# Patient Record
Sex: Female | Born: 1965 | Race: White | Hispanic: No | Smoking: Current every day smoker
Health system: Southern US, Community
[De-identification: ages and names within clinical notes are randomized; demographics above are authoritative.]

## PROBLEM LIST (undated history)

## (undated) DIAGNOSIS — E785 Hyperlipidemia, unspecified: Secondary | ICD-10-CM

## (undated) DIAGNOSIS — I639 Cerebral infarction, unspecified: Secondary | ICD-10-CM

## (undated) DIAGNOSIS — F419 Anxiety disorder, unspecified: Secondary | ICD-10-CM

## (undated) DIAGNOSIS — F32A Depression, unspecified: Secondary | ICD-10-CM

## (undated) DIAGNOSIS — R519 Headache, unspecified: Secondary | ICD-10-CM

## (undated) DIAGNOSIS — F329 Major depressive disorder, single episode, unspecified: Secondary | ICD-10-CM

## (undated) DIAGNOSIS — R51 Headache: Secondary | ICD-10-CM

## (undated) HISTORY — PX: BACK SURGERY: SHX140

## (undated) HISTORY — PX: ECTOPIC PREGNANCY SURGERY: SHX613

## (undated) HISTORY — PX: ABDOMINAL HYSTERECTOMY: SHX81

## (undated) HISTORY — PX: APPENDECTOMY: SHX54

---

## 2004-03-23 ENCOUNTER — Ambulatory Visit (HOSPITAL_COMMUNITY): Admission: RE | Admit: 2004-03-23 | Discharge: 2004-03-24 | Payer: Self-pay | Admitting: Neurosurgery

## 2004-10-26 ENCOUNTER — Ambulatory Visit: Payer: Self-pay | Admitting: Family Medicine

## 2005-01-11 ENCOUNTER — Other Ambulatory Visit: Payer: Self-pay

## 2005-01-19 ENCOUNTER — Inpatient Hospital Stay: Payer: Self-pay | Admitting: Unknown Physician Specialty

## 2006-04-23 ENCOUNTER — Ambulatory Visit: Payer: Self-pay | Admitting: Physician Assistant

## 2006-11-22 ENCOUNTER — Ambulatory Visit: Payer: Self-pay | Admitting: Obstetrics and Gynecology

## 2006-12-20 ENCOUNTER — Ambulatory Visit: Payer: Self-pay | Admitting: Unknown Physician Specialty

## 2007-05-14 ENCOUNTER — Inpatient Hospital Stay: Payer: Self-pay | Admitting: Internal Medicine

## 2007-05-14 ENCOUNTER — Other Ambulatory Visit: Payer: Self-pay

## 2007-12-28 IMAGING — MG UNKNOWN MG STUDY
1 series · 4 of 4 positions shown · non-contrast
Comparison: none

REASON FOR EXAM: Baseline scr mammo
COMMENTS:

[R CC · right · 4 of 4 slices shown]
[im 1/4]
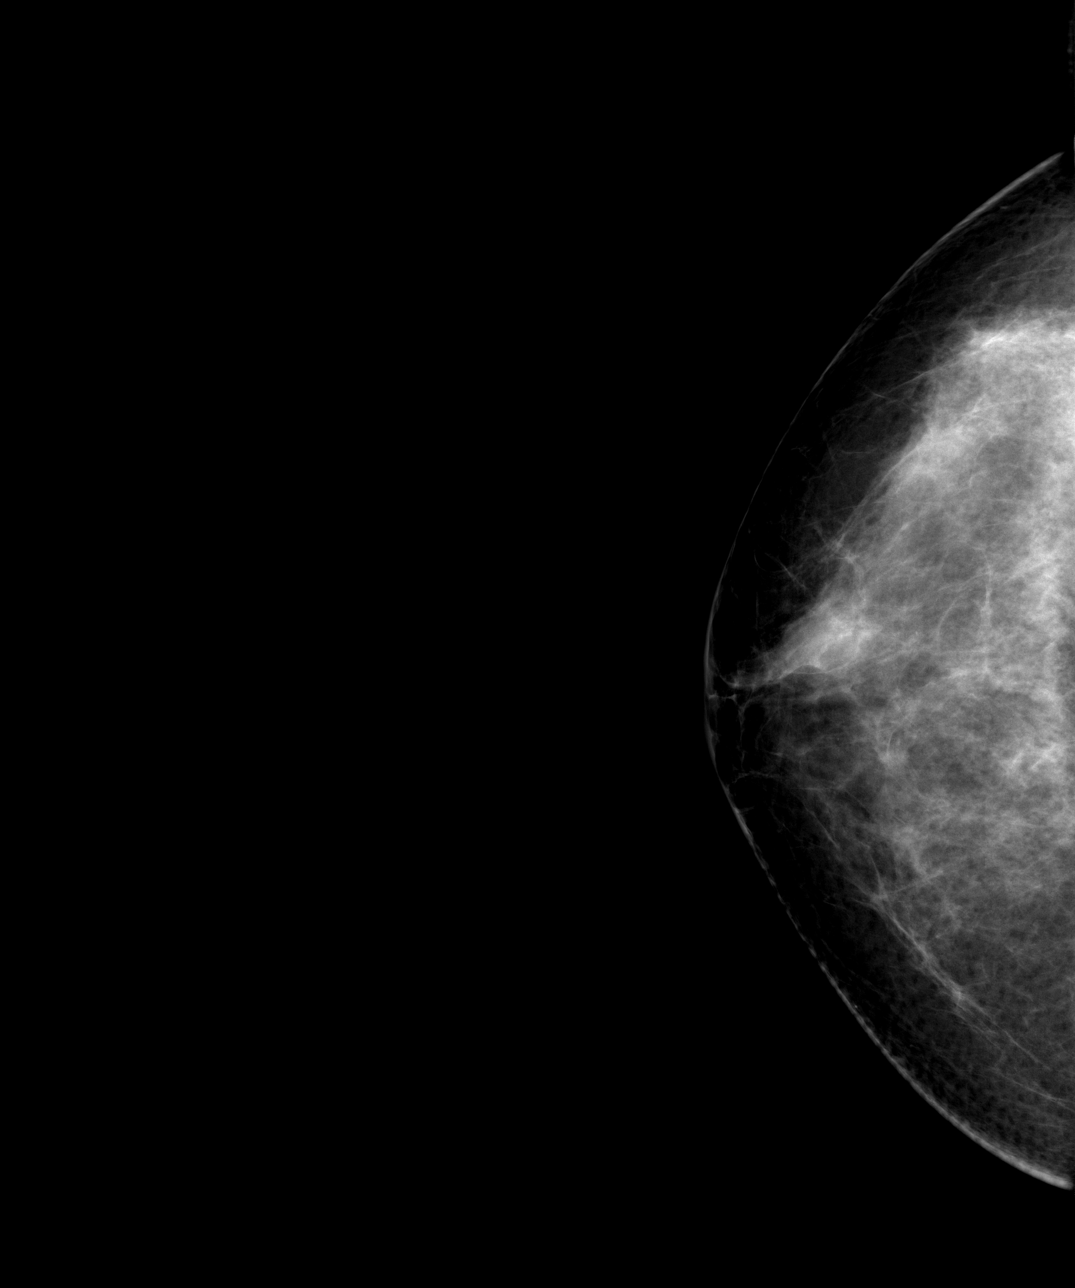
[im 2/4]
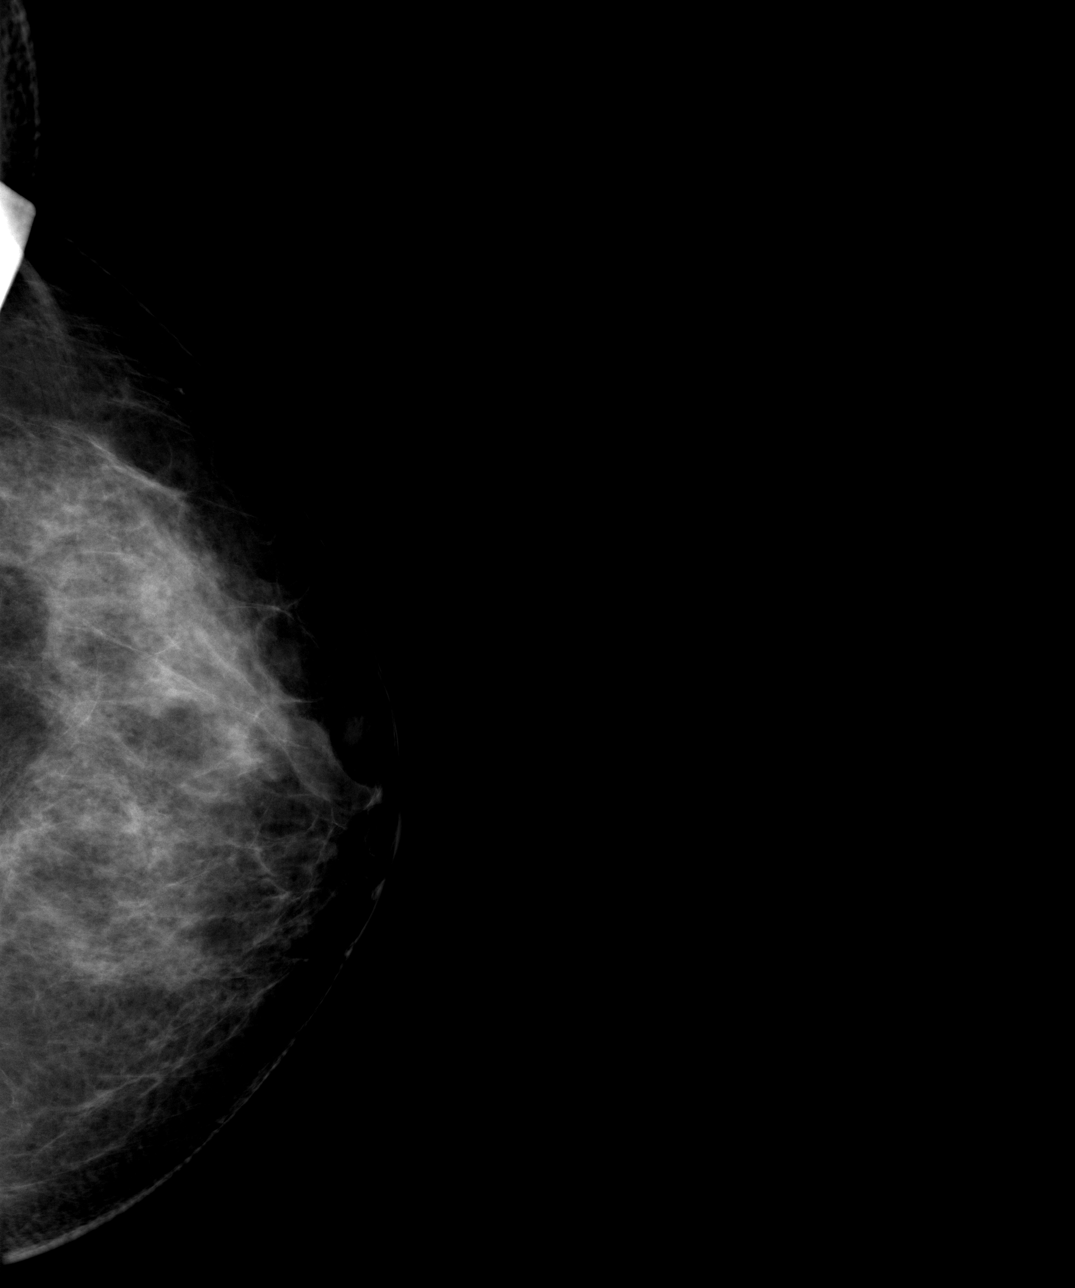
[im 3/4]
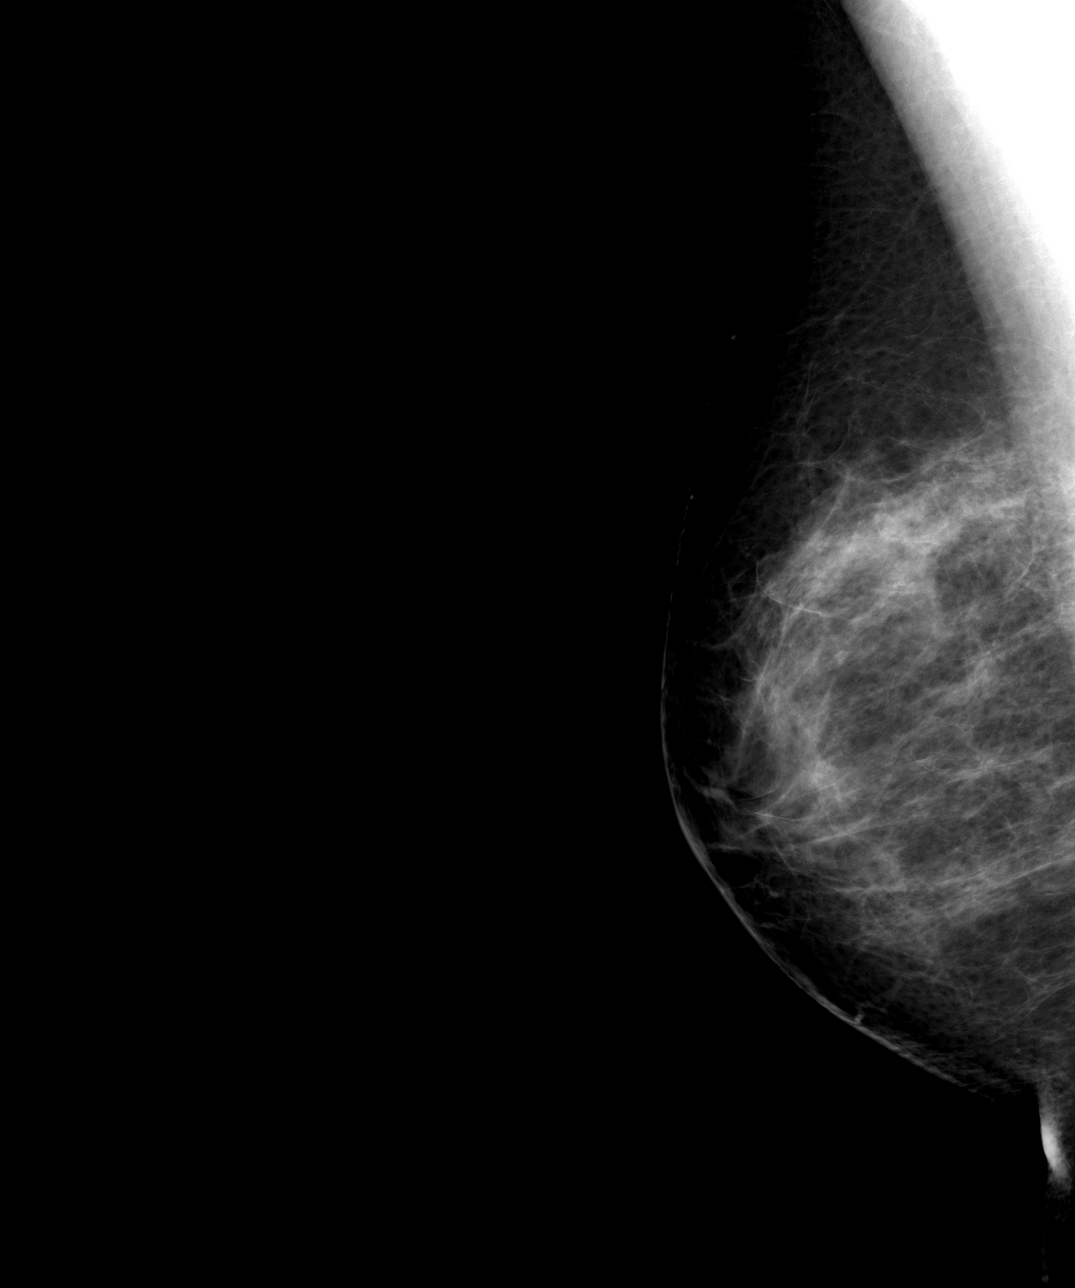
[im 4/4]
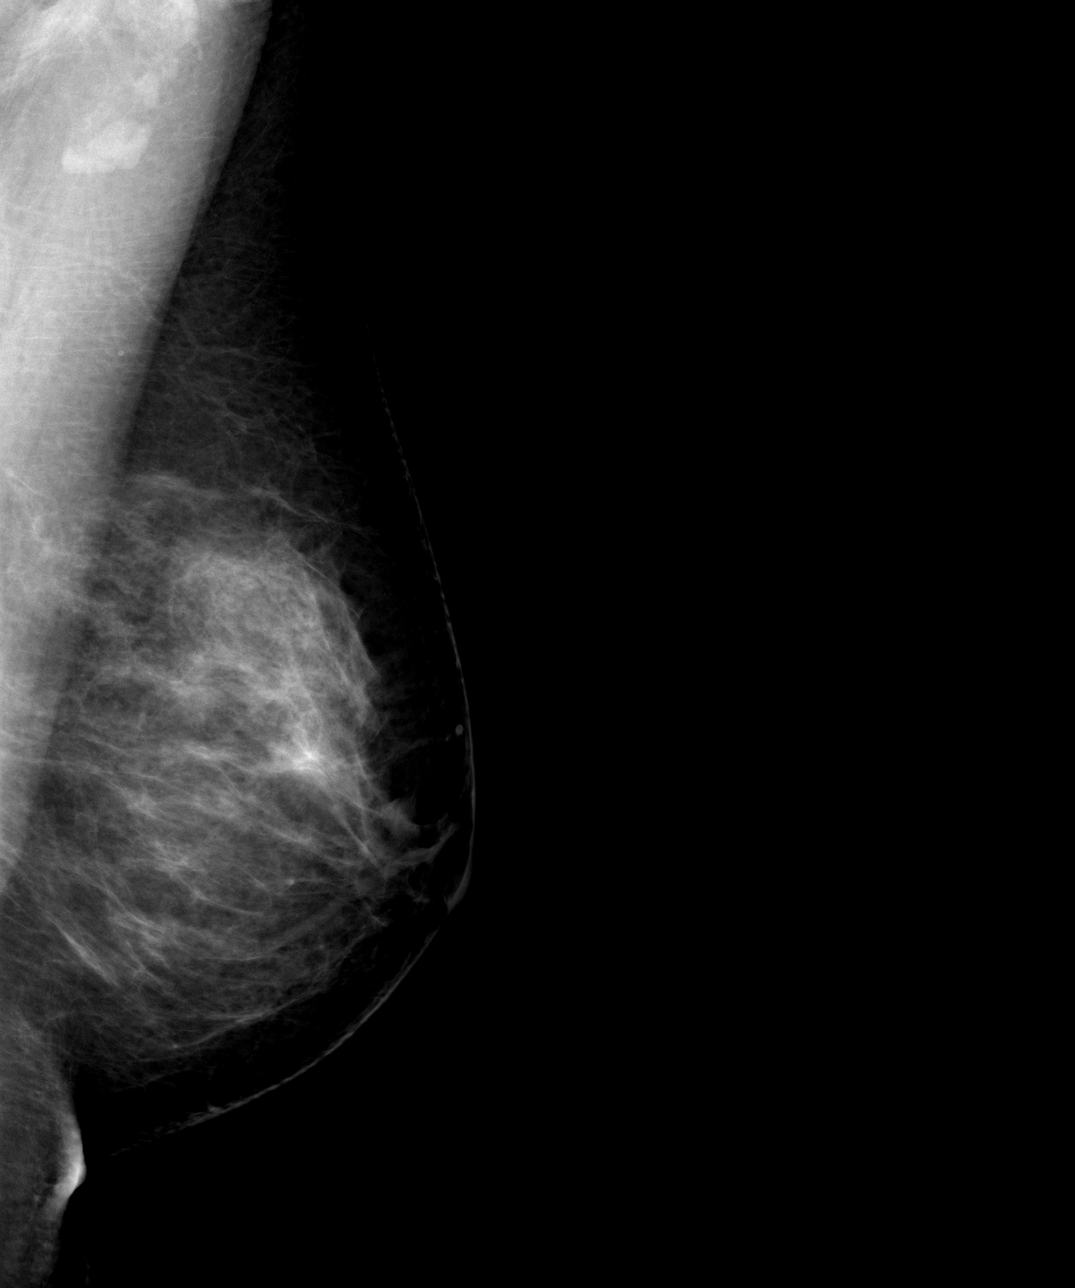

[4 of 4 positions shown; findings below may reference images not displayed]

PROCEDURE:     MAM - MAM DGTL SCREENING MAMMO W/CAD  - November 22, 2006  [DATE]

RESULT:       The current exam is compared to a prior exam dated 03/05/02
from [REDACTED]  The current exam reveals no dominant mass or
malignant appearing microcalcifications.   The breast parenchyma is
moderately dense.
IMPRESSION: 1.     Bilaterally benign-appearing screening mammography.
2.     Annual screening mammography is recommended.
3.     BI-RADS:  Category 2- Benign Finding.

A NEGATIVE MAMMOGRAM REPORT DOES NOT PRECLUDE BIOPSY OR OTHER EVALUATION OF
A CLINICALLY PALPABLE OR OTHERWISE SUSPICOUS MASS OR LESION.  BREAST CANCER
MAY NOT BE DETECTED BY MAMMOGRAPHY IN UP TO 10% OF CASES.

## 2008-04-01 ENCOUNTER — Ambulatory Visit: Payer: Self-pay | Admitting: Pain Medicine

## 2008-04-07 ENCOUNTER — Ambulatory Visit: Payer: Self-pay | Admitting: Pain Medicine

## 2008-04-22 ENCOUNTER — Ambulatory Visit: Payer: Self-pay | Admitting: Pain Medicine

## 2008-05-20 ENCOUNTER — Ambulatory Visit: Payer: Self-pay | Admitting: Physician Assistant

## 2009-03-01 ENCOUNTER — Ambulatory Visit: Payer: Self-pay | Admitting: Unknown Physician Specialty

## 2009-04-26 ENCOUNTER — Ambulatory Visit: Payer: Self-pay | Admitting: Neurology

## 2010-03-23 ENCOUNTER — Ambulatory Visit: Payer: Self-pay | Admitting: Unknown Physician Specialty

## 2010-12-18 ENCOUNTER — Emergency Department: Payer: Self-pay | Admitting: Emergency Medicine

## 2011-04-28 IMAGING — MG MAM DGTL SCREENING MAMMO W/CAD
1 series · 4 of 4 positions shown · non-contrast
Comparison: none

REASON FOR EXAM: SCR
COMMENTS:

[Series 241: R CC · right · 4 of 4 slices shown]
[im 1/4]
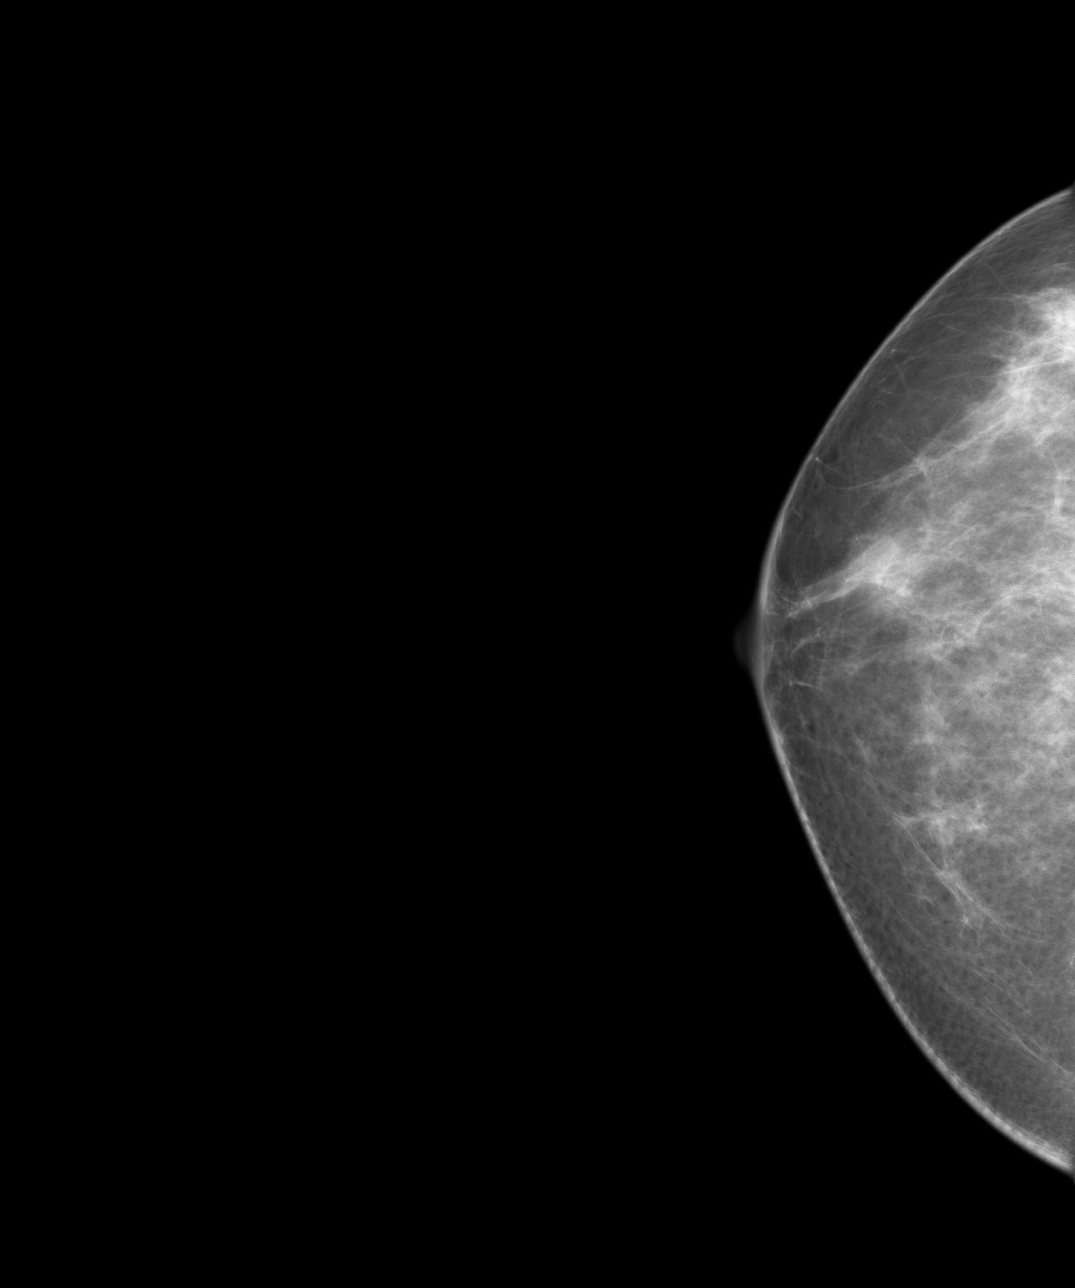
[im 2/4]
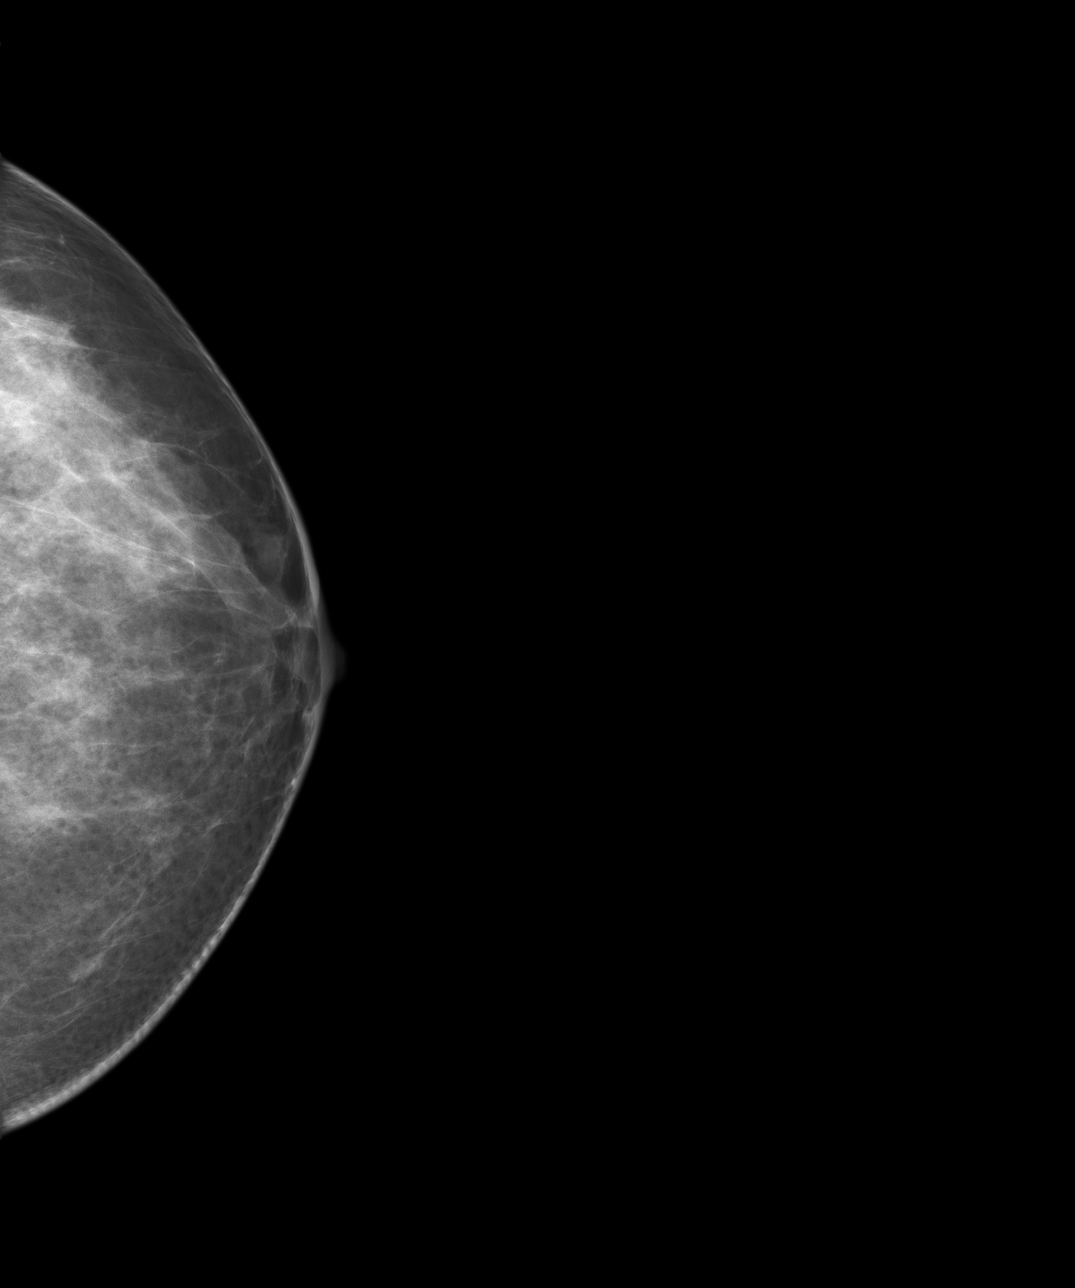
[im 3/4]
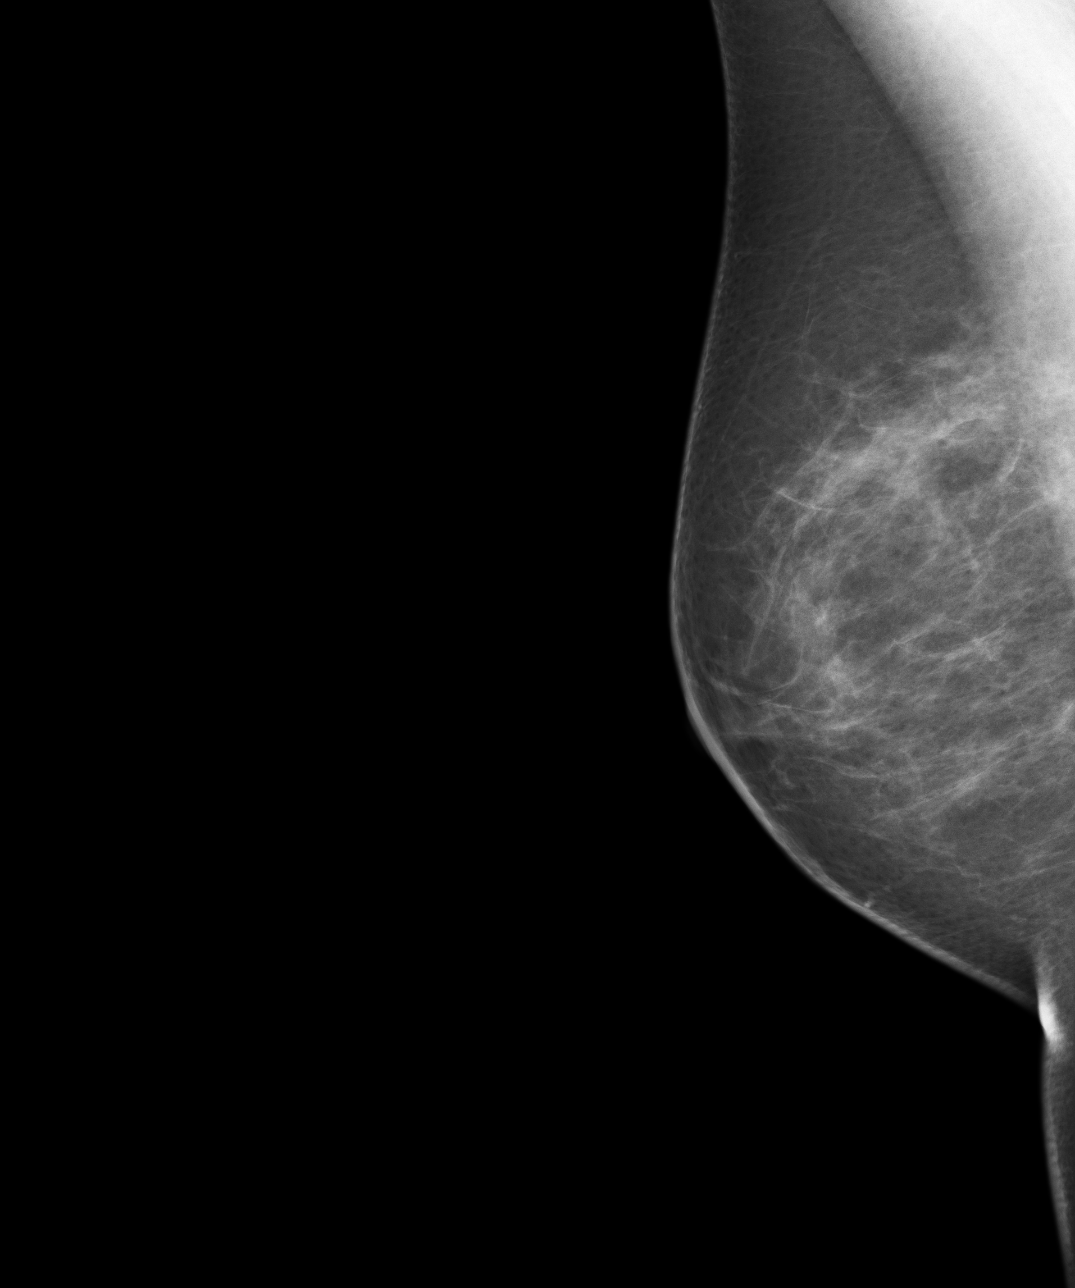
[im 4/4]
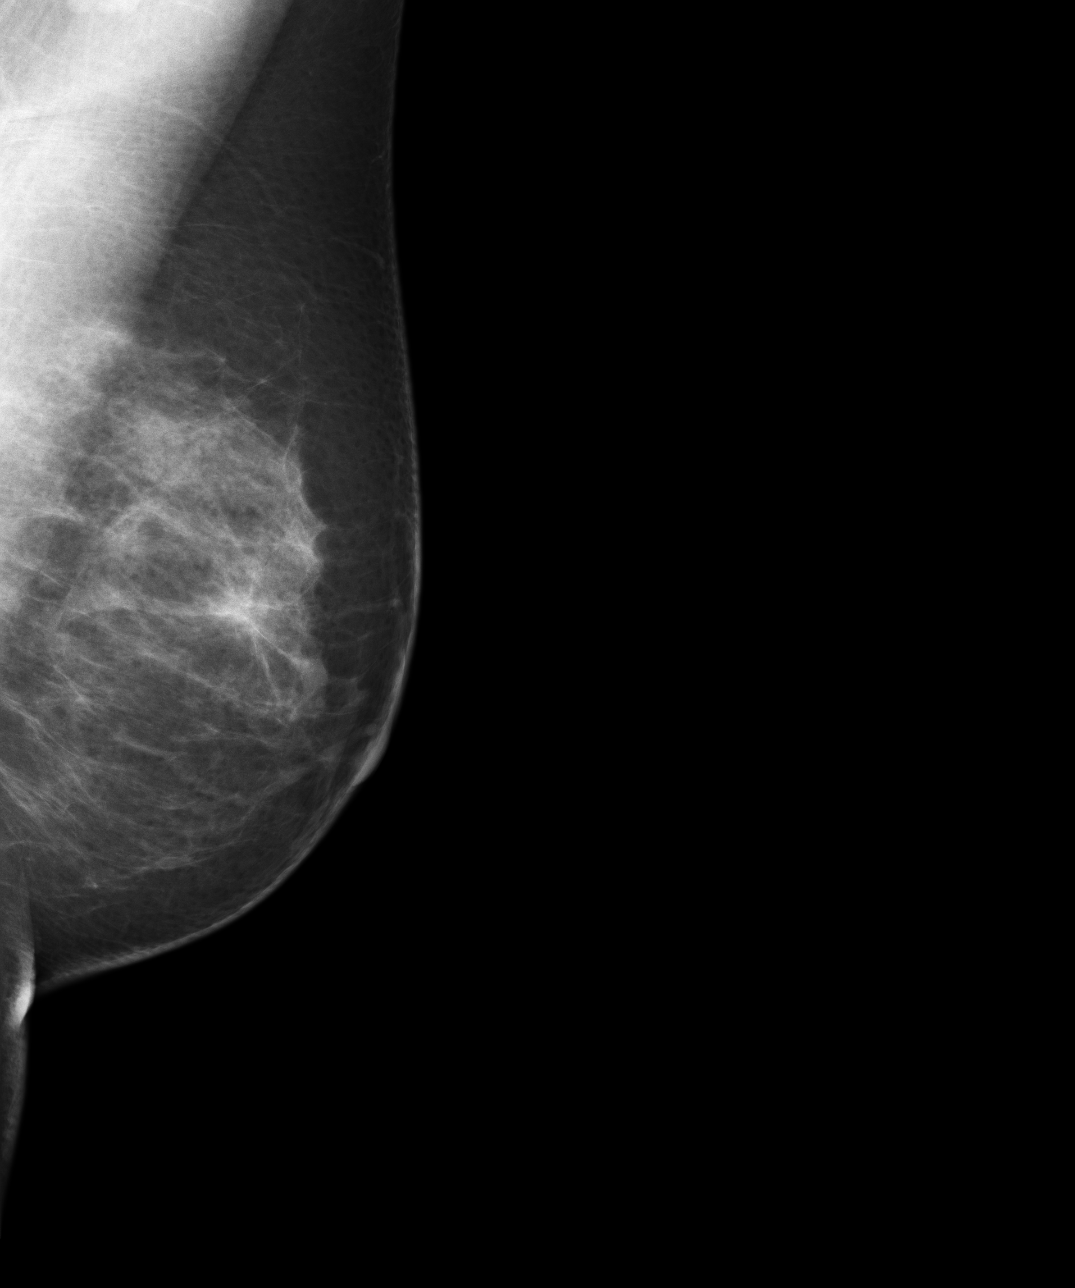

[4 of 4 positions shown; findings below may reference images not displayed]

PROCEDURE:     MAM - MAM DGTL SCREENING MAMMO W/CAD  - March 23, 2010  [DATE]

RESULT:       Comparison is made to the previous digital images dated
03/01/09 as well as 11/22/06.  The breasts exhibit a moderately dense
parenchymal pattern which has a stable appearance.  There is no developing
density, dominant mass or malignant-appearing calcification.
IMPRESSION: 1.     Stable benign-appearing bilateral mammogram.
2.     BI-RADS:  Category 2- Benign Finding.
3.     Please continue to encourage annual mammographic follow up.

A negative mammogram report does not preclude biopsy or other evaluation of
a clinically palpable or otherwise suspicious mass or lesion. Breast cancer
may not be detected by mammography in up to 10% of cases.

## 2018-04-11 ENCOUNTER — Inpatient Hospital Stay: Admission: RE | Admit: 2018-04-11 | Payer: Self-pay | Source: Ambulatory Visit

## 2018-04-18 ENCOUNTER — Other Ambulatory Visit: Payer: Self-pay

## 2018-04-18 ENCOUNTER — Encounter
Admission: RE | Admit: 2018-04-18 | Discharge: 2018-04-18 | Disposition: A | Discharge status: Home / Self Care | Payer: Medicare Other | Source: Ambulatory Visit | Attending: Podiatry | Admitting: Podiatry

## 2018-04-18 ENCOUNTER — Other Ambulatory Visit: Payer: Self-pay | Admitting: Podiatry

## 2018-04-18 DIAGNOSIS — Z01818 Encounter for other preprocedural examination: Secondary | ICD-10-CM | POA: Insufficient documentation

## 2018-04-18 DIAGNOSIS — Z01810 Encounter for preprocedural cardiovascular examination: Secondary | ICD-10-CM | POA: Insufficient documentation

## 2018-04-18 DIAGNOSIS — M21371 Foot drop, right foot: Secondary | ICD-10-CM | POA: Diagnosis not present

## 2018-04-18 HISTORY — DX: Headache, unspecified: R51.9

## 2018-04-18 HISTORY — DX: Major depressive disorder, single episode, unspecified: F32.9

## 2018-04-18 HISTORY — DX: Depression, unspecified: F32.A

## 2018-04-18 HISTORY — DX: Headache: R51

## 2018-04-18 HISTORY — DX: Anxiety disorder, unspecified: F41.9

## 2018-04-18 NOTE — Patient Instructions (Signed)
Your procedure is scheduled on: Friday 04/26/18 Report to [place]. To find out your arrival time please call [phone] between 1PM - 3PM on Thursday 04/25/18.  Remember: Instructions that are not followed completely may result in serious medical risk, up to and including death, or upon the discretion of your surgeon and anesthesiologist your surgery may need to be rescheduled.     _X__ 1. Do not eat food after midnight the night before your procedure.                 No gum chewing or hard candies. You may drink clear liquids up to 2 hours                 before you are scheduled to arrive for your surgery- DO not drink clear                 liquids within 2 hours of the start of your surgery.                 Clear Liquids include:  water, apple juice without pulp, clear carbohydrate                 drink such as Clearfast or Gatorade, Black Coffee or Tea (Do not add                 anything to coffee or tea).  __X__2.  On the morning of surgery brush your teeth with toothpaste and water, you                 may rinse your mouth with mouthwash if you wish.  Do not swallow any              toothpaste of mouthwash.     _X__ 3.  No Alcohol for 24 hours before or after surgery.   _X__ 4.  Do Not Smoke or use e-cigarettes For 24 Hours Prior to Your Surgery.                 Do not use any chewable tobacco products for at least 6 hours prior to                 surgery.  ____  5.  Bring all medications with you on the day of surgery if instructed.   __X__  6.  Notify your doctor if there is any change in your medical condition      (cold, fever, infections).     Do not wear jewelry, make-up, hairpins, clips or nail polish. Do not wear lotions, powders, or perfumes.  Do not shave 48 hours prior to surgery. Men may shave face and neck. Do not bring valuables to the hospital.    [facility] is not responsible for any belongings or  valuables.  Contacts, dentures/partials or body piercings may not be worn into surgery. Bring a case for your contacts, glasses or hearing aids, a denture cup will be supplied. Leave your suitcase in the car. After surgery it may be brought to your room. For patients admitted to the hospital, discharge time is determined by your treatment team.   Patients discharged the day of surgery will not be allowed to drive home.   Please read over the following fact sheets that you were given:   MRSA Information  __X__ Take these medicines the morning of surgery with A SIP OF WATER:  1. none  2.   3.   4.  5.  6.  ____ Fleet Enema (as directed)   __X__ Use CHG Soap/SAGE wipes as directed  ____ Use inhalers on the day of surgery  ____ Stop metformin/Janumet/Farxiga 2 days prior to surgery    ____ Take 1/2 of usual insulin dose the night before surgery. No insulin the morning          of surgery.   __x__ Stop Blood Thinners Coumadin/Plavix/Xarelto/Pleta/Pradaxa/Eliquis/Effient/Aspirin  on   Or contact your Surgeon, Cardiologist or Medical Doctor regarding  ability to stop your blood thinners  __X__ Stop Anti-inflammatories 7 days before surgery such as Advil, Ibuprofen, Motrin,  BC or Goodies Powder, Naprosyn, Naproxen, Aleve, Aspirin    __X__ Stopall herbal supplements, fish oil or vitamin E until after surgery.    ____ Bring C-Pap to the hospital.

## 2018-04-25 MED ORDER — CEFAZOLIN SODIUM-DEXTROSE 2-4 GM/100ML-% IV SOLN
2.0000 g | INTRAVENOUS | Status: AC
  Administered 2018-04-26: 2 g via INTRAVENOUS

## 2018-04-26 ENCOUNTER — Encounter: Payer: Self-pay | Admitting: *Deleted

## 2018-04-26 ENCOUNTER — Ambulatory Visit: Payer: Medicare Other | Admitting: Anesthesiology

## 2018-04-26 ENCOUNTER — Other Ambulatory Visit: Payer: Self-pay

## 2018-04-26 ENCOUNTER — Encounter
Admission: RE | Disposition: A | Discharge status: Home / Self Care | Payer: Self-pay | Source: Ambulatory Visit | Attending: Podiatry

## 2018-04-26 ENCOUNTER — Ambulatory Visit
Admission: RE | Admit: 2018-04-26 | Discharge: 2018-04-26 | Disposition: A | Discharge status: Home / Self Care | Payer: Medicare Other | Source: Ambulatory Visit | Attending: Podiatry | Admitting: Podiatry

## 2018-04-26 DIAGNOSIS — F17200 Nicotine dependence, unspecified, uncomplicated: Secondary | ICD-10-CM | POA: Diagnosis not present

## 2018-04-26 DIAGNOSIS — F419 Anxiety disorder, unspecified: Secondary | ICD-10-CM | POA: Diagnosis not present

## 2018-04-26 DIAGNOSIS — M21371 Foot drop, right foot: Secondary | ICD-10-CM | POA: Diagnosis present

## 2018-04-26 DIAGNOSIS — Z79899 Other long term (current) drug therapy: Secondary | ICD-10-CM | POA: Diagnosis not present

## 2018-04-26 DIAGNOSIS — F329 Major depressive disorder, single episode, unspecified: Secondary | ICD-10-CM | POA: Insufficient documentation

## 2018-04-26 HISTORY — PX: TENDON TRANSFER: SHX6109

## 2018-04-26 SURGERY — TRANSFER, TENDON
Anesthesia: General | Site: Foot | Laterality: Right | Wound class: Clean

## 2018-04-26 MED ORDER — FENTANYL CITRATE (PF) 100 MCG/2ML IJ SOLN
INTRAMUSCULAR | Status: DC | PRN
  Administered 2018-04-26 (×2): 25 ug via INTRAVENOUS
  Administered 2018-04-26: 50 ug via INTRAVENOUS

## 2018-04-26 MED ORDER — POVIDONE-IODINE 7.5 % EX SOLN
Freq: Once | CUTANEOUS | Status: DC
  Filled 2018-04-26: qty 118

## 2018-04-26 MED ORDER — LIDOCAINE HCL (PF) 2 % IJ SOLN
INTRAMUSCULAR | Status: AC
  Filled 2018-04-26: qty 10

## 2018-04-26 MED ORDER — FAMOTIDINE 20 MG PO TABS: 20.0000 mg | ORAL_TABLET | Freq: Once | ORAL | Status: DC

## 2018-04-26 MED ORDER — MIDAZOLAM HCL 2 MG/2ML IJ SOLN
INTRAMUSCULAR | Status: DC | PRN
  Administered 2018-04-26: 2 mg via INTRAVENOUS

## 2018-04-26 MED ORDER — ONDANSETRON HCL 4 MG/2ML IJ SOLN: 4.0000 mg | Freq: Four times a day (QID) | INTRAMUSCULAR | Status: DC | PRN

## 2018-04-26 MED ORDER — FENTANYL CITRATE (PF) 100 MCG/2ML IJ SOLN
INTRAMUSCULAR | Status: AC
  Administered 2018-04-26: 25 ug via INTRAVENOUS
  Filled 2018-04-26: qty 2

## 2018-04-26 MED ORDER — ONDANSETRON HCL 4 MG/2ML IJ SOLN
INTRAMUSCULAR | Status: AC
  Filled 2018-04-26: qty 2

## 2018-04-26 MED ORDER — PROPOFOL 10 MG/ML IV BOLUS
INTRAVENOUS | Status: DC | PRN
  Administered 2018-04-26: 160 mg via INTRAVENOUS
  Administered 2018-04-26: 20 mg via INTRAVENOUS

## 2018-04-26 MED ORDER — ONDANSETRON HCL 4 MG/2ML IJ SOLN: 4.0000 mg | Freq: Once | INTRAMUSCULAR | Status: DC | PRN

## 2018-04-26 MED ORDER — LIDOCAINE HCL (CARDIAC) PF 100 MG/5ML IV SOSY
PREFILLED_SYRINGE | INTRAVENOUS | Status: DC | PRN
  Administered 2018-04-26: 60 mg via INTRAVENOUS

## 2018-04-26 MED ORDER — FENTANYL CITRATE (PF) 100 MCG/2ML IJ SOLN
25.0000 ug | INTRAMUSCULAR | Status: AC | PRN
  Administered 2018-04-26 (×6): 25 ug via INTRAVENOUS

## 2018-04-26 MED ORDER — CEFAZOLIN SODIUM-DEXTROSE 2-4 GM/100ML-% IV SOLN
INTRAVENOUS | Status: AC
  Filled 2018-04-26: qty 100

## 2018-04-26 MED ORDER — GLYCOPYRROLATE 0.2 MG/ML IJ SOLN
INTRAMUSCULAR | Status: AC
Start: 2018-04-26 — End: ?
  Filled 2018-04-26: qty 1

## 2018-04-26 MED ORDER — TRAMADOL HCL 50 MG PO TABS: 50.0000 mg | ORAL_TABLET | Freq: Four times a day (QID) | ORAL | 0 refills | Status: AC | PRN

## 2018-04-26 MED ORDER — ONDANSETRON HCL 4 MG PO TABS: 4.0000 mg | ORAL_TABLET | Freq: Four times a day (QID) | ORAL | Status: DC | PRN

## 2018-04-26 MED ORDER — TRAMADOL HCL 50 MG PO TABS
50.0000 mg | ORAL_TABLET | Freq: Once | ORAL | Status: AC
Start: 2018-04-26 — End: 2018-04-26
  Administered 2018-04-26: 50 mg via ORAL

## 2018-04-26 MED ORDER — FAMOTIDINE 20 MG PO TABS
ORAL_TABLET | ORAL | Status: AC
  Administered 2018-04-26: 20 mg
  Filled 2018-04-26: qty 1

## 2018-04-26 MED ORDER — NEOMYCIN-POLYMYXIN B GU 40-200000 IR SOLN
Status: DC | PRN
  Administered 2018-04-26: 2 mL

## 2018-04-26 MED ORDER — DEXAMETHASONE SODIUM PHOSPHATE 10 MG/ML IJ SOLN
INTRAMUSCULAR | Status: DC | PRN
  Administered 2018-04-26: 10 mg via INTRAVENOUS

## 2018-04-26 MED ORDER — PROPOFOL 10 MG/ML IV BOLUS
INTRAVENOUS | Status: AC
  Filled 2018-04-26: qty 20

## 2018-04-26 MED ORDER — LACTATED RINGERS IV SOLN
INTRAVENOUS | Status: DC
  Administered 2018-04-26: 11:00:00 via INTRAVENOUS

## 2018-04-26 MED ORDER — DEXAMETHASONE SODIUM PHOSPHATE 10 MG/ML IJ SOLN
INTRAMUSCULAR | Status: AC
  Filled 2018-04-26: qty 1

## 2018-04-26 MED ORDER — MIDAZOLAM HCL 2 MG/2ML IJ SOLN
INTRAMUSCULAR | Status: AC
  Filled 2018-04-26: qty 2

## 2018-04-26 MED ORDER — TRAMADOL HCL 50 MG PO TABS
ORAL_TABLET | ORAL | Status: AC
  Administered 2018-04-26: 50 mg via ORAL
  Filled 2018-04-26: qty 1

## 2018-04-26 MED ORDER — BUPIVACAINE-EPINEPHRINE (PF) 0.25% -1:200000 IJ SOLN
INTRAMUSCULAR | Status: DC | PRN
  Administered 2018-04-26: 8 mL via PERINEURAL

## 2018-04-26 MED ORDER — ONDANSETRON HCL 4 MG/2ML IJ SOLN
INTRAMUSCULAR | Status: DC | PRN
  Administered 2018-04-26: 4 mg via INTRAVENOUS

## 2018-04-26 MED ORDER — FENTANYL CITRATE (PF) 100 MCG/2ML IJ SOLN
INTRAMUSCULAR | Status: AC
  Filled 2018-04-26: qty 2

## 2018-04-26 SURGICAL SUPPLY — 63 items
ANCHOR SWIVELOCK BIO COMP (Anchor) ×2 IMPLANT
BANDAGE ELASTIC 4 LF NS (GAUZE/BANDAGES/DRESSINGS) ×4 IMPLANT
BLADE SURG 15 STRL LF DISP TIS (BLADE) ×2 IMPLANT
BLADE SURG 15 STRL SS (BLADE) ×2
BLADE SURG MINI STRL (BLADE) ×2 IMPLANT
BNDG COHESIVE 4X5 TAN STRL (GAUZE/BANDAGES/DRESSINGS) ×2 IMPLANT
BNDG CONFORM 3 STRL LF (GAUZE/BANDAGES/DRESSINGS) ×2 IMPLANT
BNDG ESMARK 4X12 TAN STRL LF (GAUZE/BANDAGES/DRESSINGS) ×2 IMPLANT
CANISTER SUCT 1200ML W/VALVE (MISCELLANEOUS) ×2 IMPLANT
CUFF TOURN 18 STER (MISCELLANEOUS) ×2 IMPLANT
DRAPE FLUOR MINI C-ARM 54X84 (DRAPES) ×2 IMPLANT
DURAPREP 26ML APPLICATOR (WOUND CARE) ×2 IMPLANT
ELECT REM PT RETURN 9FT ADLT (ELECTROSURGICAL) ×2
ELECTRODE REM PT RTRN 9FT ADLT (ELECTROSURGICAL) ×1 IMPLANT
ETHIBOND 2 0 GREEN CT 2 30IN (SUTURE) ×2 IMPLANT
GAUZE PETRO XEROFOAM 1X8 (MISCELLANEOUS) ×2 IMPLANT
GAUZE SPONGE 4X4 12PLY STRL (GAUZE/BANDAGES/DRESSINGS) ×2 IMPLANT
GAUZE STRETCH 2X75IN STRL (MISCELLANEOUS) ×2 IMPLANT
GAUZE XEROFORM 4X4 STRL (GAUZE/BANDAGES/DRESSINGS) ×2 IMPLANT
GLOVE BIO SURGEON STRL SZ7.5 (GLOVE) ×2 IMPLANT
GLOVE INDICATOR 8.0 STRL GRN (GLOVE) ×2 IMPLANT
GOWN STRL REUS W/ TWL LRG LVL3 (GOWN DISPOSABLE) ×2 IMPLANT
GOWN STRL REUS W/TWL LRG LVL3 (GOWN DISPOSABLE) ×2
HANDLE YANKAUER SUCT BULB TIP (MISCELLANEOUS) ×2 IMPLANT
KIT SUTURE 1.8 Q-FIX DISP (KITS) ×2 IMPLANT
KIT TRANSTIBIAL (DISPOSABLE) ×2 IMPLANT
KIT TURNOVER KIT A (KITS) ×2 IMPLANT
LABEL OR SOLS (LABEL) ×2 IMPLANT
NDL MAYO CATGUT SZ5 (NEEDLE)
NDL SUT 5 .5 CRC TPR PNT MAYO (NEEDLE) IMPLANT
NEEDLE FILTER BLUNT 18X 1/2SAF (NEEDLE) ×1
NEEDLE FILTER BLUNT 18X1 1/2 (NEEDLE) ×1 IMPLANT
NEEDLE HYPO 25X1 1.5 SAFETY (NEEDLE) ×2 IMPLANT
NS IRRIG 500ML POUR BTL (IV SOLUTION) ×2 IMPLANT
PACK EXTREMITY ARMC (MISCELLANEOUS) ×2 IMPLANT
PAD CAST CTTN 4X4 STRL (SOFTGOODS) ×1 IMPLANT
PADDING CAST COTTON 4X4 STRL (SOFTGOODS) ×1
RASP SM TEAR CROSS CUT (RASP) ×2 IMPLANT
REAMER 6 CANN FEM STRL (BIT) ×2 IMPLANT
SOL PREP PVP 2OZ (MISCELLANEOUS) ×2
SOLUTION PREP PVP 2OZ (MISCELLANEOUS) ×1 IMPLANT
SPLINT CAST 1 STEP 5X30 WHT (MISCELLANEOUS) ×2 IMPLANT
SPLINT FAST PLASTER 5X30 (CAST SUPPLIES) ×1
SPLINT PLASTER CAST FAST 5X30 (CAST SUPPLIES) ×1 IMPLANT
SPONGE LAP 18X18 RF (DISPOSABLE) ×2 IMPLANT
STOCKINETTE M/LG 89821 (MISCELLANEOUS) ×2 IMPLANT
STRIP CLOSURE SKIN 1/2X4 (GAUZE/BANDAGES/DRESSINGS) ×2 IMPLANT
SUT 2 FIBERLOOP 20 STRT BLUE (SUTURE) ×2
SUT ETHILON 3-0 FS-10 30 BLK (SUTURE) ×2
SUT MNCRL+ 5-0 VIOLET P-3 (SUTURE) ×1 IMPLANT
SUT MONOCRYL 5-0 (SUTURE) ×1
SUT VIC AB 0 SH 27 (SUTURE) ×2 IMPLANT
SUT VIC AB 2-0 SH 27 (SUTURE) ×2
SUT VIC AB 2-0 SH 27XBRD (SUTURE) ×2 IMPLANT
SUT VIC AB 3-0 SH 27 (SUTURE) ×2
SUT VIC AB 3-0 SH 27X BRD (SUTURE) ×2 IMPLANT
SUT VIC AB 4-0 FS2 27 (SUTURE) ×4 IMPLANT
SUT VICRYL AB 3-0 FS1 BRD 27IN (SUTURE) ×2 IMPLANT
SUTURE 2 FIBERLOOP 20 STRT BLU (SUTURE) ×1 IMPLANT
SUTURE EHLN 3-0 FS-10 30 BLK (SUTURE) ×1 IMPLANT
SWABSTK COMLB BENZOIN TINCTURE (MISCELLANEOUS) ×2 IMPLANT
SYR 10ML LL (SYRINGE) ×4 IMPLANT
SYR 3ML LL SCALE MARK (SYRINGE) ×2 IMPLANT

## 2018-04-26 NOTE — Transfer of Care (Signed)
Immediate Anesthesia Transfer of Care Note  Patient: Diana Jarvis  Procedure(s) Performed: TENDON TRANSFER-ANTERIOR TIBIAL TENDON TRANSFER (Right Foot)  Patient Location: PACU  Anesthesia Type:General  Level of Consciousness: sedated  Airway & Oxygen Therapy: Patient Spontanous Breathing and Patient connected to face mask oxygen  Post-op Assessment: Report given to RN and Post -op Vital signs reviewed and stable  Post vital signs: Reviewed and stable  Last Vitals:  Vitals Value Taken Time  BP 119/78 04/26/2018  1:49 PM  Temp 37.1 C 04/26/2018  1:49 PM  Pulse 102 04/26/2018  1:55 PM  Resp 17 04/26/2018  1:55 PM  SpO2 100 % 04/26/2018  1:55 PM  Vitals shown include unvalidated device data.  Last Pain:  Vitals:   04/26/18 1349  TempSrc:   PainSc: Asleep      Patients Stated Pain Goal: 3 (19/47/12 5271)  Complications: No apparent anesthesia complications

## 2018-04-26 NOTE — Anesthesia Postprocedure Evaluation (Signed)
Anesthesia Post Note  Patient: Diana Jarvis  Procedure(s) Performed: TENDON TRANSFER-ANTERIOR TIBIAL TENDON TRANSFER (Right Foot)  Patient location during evaluation: PACU Anesthesia Type: General Level of consciousness: awake and alert Pain management: pain level controlled Vital Signs Assessment: post-procedure vital signs reviewed and stable Respiratory status: spontaneous breathing, nonlabored ventilation, respiratory function stable and patient connected to nasal cannula oxygen Cardiovascular status: blood pressure returned to baseline and stable Postop Assessment: no apparent nausea or vomiting Anesthetic complications: no     Last Vitals:  Vitals:   04/26/18 1434 04/26/18 1452  BP: 103/74 97/68  Pulse: (!) 104 89  Resp: 19 16  Temp:  (!) 36.3 C  SpO2: 91% 94%    Last Pain:  Vitals:   04/26/18 1452  TempSrc: Tympanic  PainSc: [place]

## 2018-04-26 NOTE — Anesthesia Post-op Follow-up Note (Signed)
Anesthesia QCDR form completed.        

## 2018-04-26 NOTE — Discharge Instructions (Signed)

## 2018-04-26 NOTE — Anesthesia Procedure Notes (Signed)
Procedure Name: LMA Insertion Date/Time: 04/26/2018 12:13 PM Performed by: Eben Burow, CRNA Pre-anesthesia Checklist: Patient identified, Emergency Drugs available, Suction available, Patient being monitored and Timeout performed Patient Re-evaluated:Patient Re-evaluated prior to induction Oxygen Delivery Method: Circle system utilized Preoxygenation: Pre-oxygenation with 100% oxygen Induction Type: IV induction LMA: LMA inserted LMA Size: 4.0 Number of attempts: 1 Placement Confirmation: positive ETCO2 and breath sounds checked- equal and bilateral Tube secured with: Tape Dental Injury: Teeth and Oropharynx as per pre-operative assessment

## 2018-04-26 NOTE — Anesthesia Preprocedure Evaluation (Signed)
Anesthesia Evaluation  Patient identified by MRN, date of birth, ID band Patient awake    Reviewed: Allergy & Precautions, NPO status , Patient's Chart, lab work & pertinent test results, reviewed documented beta blocker date and time   Airway Mallampati: II  TM Distance: >3 FB     Dental  (+) Chipped   Pulmonary Current Smoker,           Cardiovascular      Neuro/Psych  Headaches, PSYCHIATRIC DISORDERS Anxiety Depression    GI/Hepatic   Endo/Other    Renal/GU      Musculoskeletal   Abdominal   Peds  Hematology   Anesthesia Other Findings Uses B-blocker.  Reproductive/Obstetrics                             Anesthesia Physical Anesthesia Plan  ASA: III  Anesthesia Plan: General   Post-op Pain Management:    Induction: Intravenous  PONV Risk Score and Plan:   Airway Management Planned: LMA  Additional Equipment:   Intra-op Plan:   Post-operative Plan:   Informed Consent: I have reviewed the patients History and Physical, chart, labs and discussed the procedure including the risks, benefits and alternatives for the proposed anesthesia with the patient or authorized representative who has indicated his/her understanding and acceptance.     Plan Discussed with: CRNA  Anesthesia Plan Comments:         Anesthesia Quick Evaluation

## 2018-04-26 NOTE — Op Note (Signed)
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    EBL: Minimal    Anesthesia:local and general    Hemostasis: 0.25% bupivacaine with epinephrine infiltrated at incision sites    Specimen: None    Complications: None    Operative indications:Diana Jarvis is an 52 y.o. that presents today for surgical intervention.  The risks/benefits/alternatives/complications have been discussed and consent has been given.    Procedure:  Patient was brought into the OR and placed on the operating table in thesupine position. After anesthesia was obtained theright lower extremity was prepped and draped in usual sterile fashion.  Attention was directed to the right lower extremity where at the anterior medial insertion of the anterior tibial tendon a small longitudinal incision was performed.  Sharp and blunt dissection carried down to the tendon.  The tendon was then incised completely from its insertion.  On the anterior aspect of the lower leg a small incision was made with sharp and blunt dissection carried down to the anterior tibial tendon sheath.  This was then entered.  A third incision was made overlying the lateral cuneiform on the right foot.  Sharp and blunt dissection carried down to the periosteum.  The anterior tibial tendon was initially delivered from its medial insertion to the lower leg incision on the tibia.  It was then delivered into the lateral cuneiform incision site.  A stitch was placed through the tendon.  A 5.5 mm Bio-Tenodesis screw from [employer] was placed into the lateral cuneiform.  Next the tendon was delivered through the drill hole delivered into the cuneiform.  A Keith needle had been used with a whipstitch and this was delivered to the plantar aspect of the foot.  The foot was held in a neutral 90 degree  position.  The 5.5 mm Bio-Tenodesis screw was then placed with excellent stability noted.  The suture was then cut.  All wounds were flushed with copious amounts of irrigation.  Layered closure performed with 3-0 and 4-0 Vicryl and the skin reapproximated with a 3-0 nylon.  Patient was placed in a equalizer walker boot with the foot held in a neutral 90 degree position.    Patient tolerated the procedure and anesthesia well.  Was transported from the OR to the PACU with all vital signs stable and vascular status intact. To be discharged per routine protocol.  Will follow up in approximately 1 week in the outpatient clinic.

## 2018-04-29 ENCOUNTER — Encounter: Payer: Self-pay | Admitting: Podiatry

## 2018-04-29 NOTE — H&P (Signed)
Pre-op H & P was performed prior to surgery.  All questions answered.  No changes in medical history.

## 2020-08-06 ENCOUNTER — Other Ambulatory Visit: Payer: Self-pay | Admitting: Family Medicine

## 2020-08-06 DIAGNOSIS — Z1231 Encounter for screening mammogram for malignant neoplasm of breast: Secondary | ICD-10-CM

## 2020-09-02 ENCOUNTER — Other Ambulatory Visit: Payer: Self-pay

## 2020-09-02 ENCOUNTER — Ambulatory Visit
Admission: RE | Admit: 2020-09-02 | Discharge: 2020-09-02 | Disposition: A | Discharge status: Home / Self Care | Payer: Medicare Other | Source: Ambulatory Visit | Attending: Family Medicine | Admitting: Family Medicine

## 2020-09-02 DIAGNOSIS — Z1231 Encounter for screening mammogram for malignant neoplasm of breast: Secondary | ICD-10-CM

## 2021-03-11 ENCOUNTER — Other Ambulatory Visit: Admission: RE | Admit: 2021-03-11 | Payer: Medicare Other | Source: Ambulatory Visit

## 2021-03-15 ENCOUNTER — Encounter: Payer: Self-pay | Admitting: *Deleted

## 2021-03-15 ENCOUNTER — Ambulatory Visit: Payer: Medicare Other | Admitting: Certified Registered"

## 2021-03-15 ENCOUNTER — Ambulatory Visit
Admission: RE | Admit: 2021-03-15 | Discharge: 2021-03-15 | Disposition: A | Discharge status: Home / Self Care | Payer: Medicare Other | Attending: Gastroenterology | Admitting: Gastroenterology

## 2021-03-15 ENCOUNTER — Other Ambulatory Visit: Payer: Self-pay

## 2021-03-15 ENCOUNTER — Encounter
Admission: RE | Disposition: A | Discharge status: Home / Self Care | Payer: Self-pay | Source: Home / Self Care | Attending: Gastroenterology

## 2021-03-15 DIAGNOSIS — Z8673 Personal history of transient ischemic attack (TIA), and cerebral infarction without residual deficits: Secondary | ICD-10-CM | POA: Diagnosis not present

## 2021-03-15 DIAGNOSIS — K6389 Other specified diseases of intestine: Secondary | ICD-10-CM | POA: Insufficient documentation

## 2021-03-15 DIAGNOSIS — Z888 Allergy status to other drugs, medicaments and biological substances status: Secondary | ICD-10-CM | POA: Insufficient documentation

## 2021-03-15 DIAGNOSIS — Z885 Allergy status to narcotic agent status: Secondary | ICD-10-CM | POA: Insufficient documentation

## 2021-03-15 DIAGNOSIS — Z7982 Long term (current) use of aspirin: Secondary | ICD-10-CM | POA: Diagnosis not present

## 2021-03-15 DIAGNOSIS — Z79899 Other long term (current) drug therapy: Secondary | ICD-10-CM | POA: Diagnosis not present

## 2021-03-15 DIAGNOSIS — Z91040 Latex allergy status: Secondary | ICD-10-CM | POA: Insufficient documentation

## 2021-03-15 DIAGNOSIS — K529 Noninfective gastroenteritis and colitis, unspecified: Secondary | ICD-10-CM | POA: Diagnosis not present

## 2021-03-15 DIAGNOSIS — Z791 Long term (current) use of non-steroidal anti-inflammatories (NSAID): Secondary | ICD-10-CM | POA: Diagnosis not present

## 2021-03-15 DIAGNOSIS — D123 Benign neoplasm of transverse colon: Secondary | ICD-10-CM | POA: Insufficient documentation

## 2021-03-15 DIAGNOSIS — E785 Hyperlipidemia, unspecified: Secondary | ICD-10-CM | POA: Insufficient documentation

## 2021-03-15 DIAGNOSIS — Z1211 Encounter for screening for malignant neoplasm of colon: Secondary | ICD-10-CM | POA: Diagnosis present

## 2021-03-15 HISTORY — DX: Cerebral infarction, unspecified: I63.9

## 2021-03-15 HISTORY — DX: Hyperlipidemia, unspecified: E78.5

## 2021-03-15 HISTORY — PX: COLONOSCOPY WITH PROPOFOL: SHX5780

## 2021-03-15 SURGERY — COLONOSCOPY WITH PROPOFOL
Anesthesia: General

## 2021-03-15 MED ORDER — PROPOFOL 500 MG/50ML IV EMUL
INTRAVENOUS | Status: DC | PRN
  Administered 2021-03-15: 125 ug/kg/min via INTRAVENOUS

## 2021-03-15 MED ORDER — PROPOFOL 500 MG/50ML IV EMUL
INTRAVENOUS | Status: AC
  Filled 2021-03-15: qty 50

## 2021-03-15 MED ORDER — SODIUM CHLORIDE 0.9 % IV SOLN: INTRAVENOUS | Status: DC

## 2021-03-15 MED ORDER — PHENYLEPHRINE HCL (PRESSORS) 10 MG/ML IV SOLN
INTRAVENOUS | Status: DC | PRN
  Administered 2021-03-15: 100 ug via INTRAVENOUS

## 2021-03-15 MED ORDER — LIDOCAINE HCL (CARDIAC) PF 100 MG/5ML IV SOSY
PREFILLED_SYRINGE | INTRAVENOUS | Status: DC | PRN
  Administered 2021-03-15: 50 mg via INTRAVENOUS

## 2021-03-15 MED ORDER — PROPOFOL 10 MG/ML IV BOLUS
INTRAVENOUS | Status: DC | PRN
  Administered 2021-03-15: 90 mg via INTRAVENOUS
  Administered 2021-03-15: 20 mg via INTRAVENOUS

## 2021-03-15 NOTE — Interval H&P Note (Signed)
History and Physical Interval Note:  03/15/2021 10:07 AM  Diana Jarvis  has presented today for surgery, with the diagnosis of SCREEN.  The various methods of treatment have been discussed with the patient and family. After consideration of risks, benefits and other options for treatment, the patient has consented to  Procedure(s): COLONOSCOPY WITH PROPOFOL (N/A) as a surgical intervention.  The patient's history has been reviewed, patient examined, no change in status, stable for surgery.  I have reviewed the patient's chart and labs.  Questions were answered to the patient's satisfaction.     Lesly Rubenstein  Ok to proceed with colonoscopy

## 2021-03-15 NOTE — Transfer of Care (Signed)
Immediate Anesthesia Transfer of Care Note  Patient: Diana Jarvis  Procedure(s) Performed: COLONOSCOPY WITH PROPOFOL (N/A )  Patient Location: PACU  Anesthesia Type:MAC  Level of Consciousness: awake, alert  and oriented  Airway & Oxygen Therapy: Patient Spontanous Breathing  Post-op Assessment: Report given to RN and Post -op Vital signs reviewed and stable  Post vital signs: stable  Last Vitals:  Vitals Value Taken Time  BP    Temp    Pulse 82 03/15/21 1043  Resp 24 03/15/21 1043  SpO2 99 % 03/15/21 1043  Vitals shown include unvalidated device data.  Last Pain:  Vitals:   03/15/21 0935  TempSrc: Temporal  PainSc: 0-No pain         Complications: No complications documented.

## 2021-03-15 NOTE — H&P (Signed)
Outpatient short stay form Pre-procedure 03/15/2021 10:02 AM Raylene Miyamoto MD, MPH  Primary Physician: Dr. Kary Kos  Reason for visit:  Screening  History of present illness:   55 y/o lady with history of HLD and anxiety here for screening colonoscopy. No family history of GI malignancies. No blood thinners. History of partial hysterectomy and ectopic pregnancy.    Current Facility-Administered Medications:  .  0.9 %  sodium chloride infusion, , Intravenous, Continuous, Alexah Kivett, Hilton Cork, MD, Last Rate: 20 mL/hr at 03/15/21 0945, New Bag at 03/15/21 0945  Medications Prior to Admission  Medication Sig Dispense Refill Last Dose  . ALPRAZolam (XANAX) 1 MG tablet Take 0.5-1 mg by mouth 2 (two) times daily as needed for anxiety.   03/14/2021 at Unknown time  . aspirin EC 81 MG tablet Take 81 mg by mouth at bedtime.   Past Week at Unknown time  . cyanocobalamin (,VITAMIN B-12,) 1000 MCG/ML injection Inject 1,000 mcg into the muscle every 14 (fourteen) days.   Past Week at Unknown time  . docusate sodium (COLACE) 100 MG capsule Take 100 mg by mouth at bedtime.   Past Week at Unknown time  . ergocalciferol (VITAMIN D2) 50000 units capsule Take 50,000 Units by mouth every Friday.   Past Week at Unknown time  . metoprolol succinate (TOPROL-XL) 25 MG 24 hr tablet Take 25 mg by mouth at bedtime.   03/14/2021 at Unknown time  . multivitamin (VIT W/EXTRA C) CHEW chewable tablet Chew 1 tablet by mouth daily.   Past Week at Unknown time  . rosuvastatin (CRESTOR) 5 MG tablet Take 5 mg by mouth at bedtime.   03/14/2021 at Unknown time  . diclofenac sodium (VOLTAREN) 1 % GEL Apply 1 application topically 4 (four) times daily as needed (for pain in foot and leg).     . fentaNYL (DURAGESIC - DOSED MCG/HR) 25 MCG/HR patch Place 25 mcg onto the skin every other day.     Marland Kitchen PARoxetine (PAXIL) 40 MG tablet Take 40 mg by mouth at bedtime.     . topiramate (TOPAMAX) 100 MG tablet Take 100 mg by mouth at bedtime.      . traMADol (ULTRAM) 50 MG tablet Take 100 mg by mouth every 6 (six) hours as needed.     . venlafaxine XR (EFFEXOR-XR) 75 MG 24 hr capsule Take 75 mg by mouth at bedtime.        Allergies  Allergen Reactions  . Latex Hives and Rash  . Codeine     Patient states interacts with fentanyl patch she is wearing  . Lyrica [Pregabalin] Other (See Comments)    Can't remember anything  . Pepto-Bismol [Bismuth] Nausea And Vomiting  . Phenergan [Promethazine Hcl] Other (See Comments)    Hallucination  . Zocor [Simvastatin] Other (See Comments)    Weak in legs     Past Medical History:  Diagnosis Date  . Anxiety   . Depression   . Headache   . Hyperlipidemia   . Stroke [facility])    TIA LAST OCTOBER    Review of systems:  Otherwise negative.    Physical Exam  Gen: Alert, oriented. Appears stated age.  HEENT: PERRLA. Lungs: No respiratory distress CV: RRR Abd: soft, benign, no masses Ext: No edema    Planned procedures: Proceed with colonoscopy. The patient understands the nature of the planned procedure, indications, risks, alternatives and potential complications including but not limited to bleeding, infection, perforation, damage to internal organs and possible oversedation/side effects from  anesthesia. The patient agrees and gives consent to proceed.  Please refer to procedure notes for findings, recommendations and patient disposition/instructions.     Raylene Miyamoto MD, MPH Gastroenterology 03/15/2021  10:02 AM

## 2021-03-15 NOTE — Op Note (Signed)
[facility] Patient Name: Diana Jarvis Procedure Date: 03/15/2021 10:03 AM MRN: [id number] Account #: [id number] Date of Birth: 1966/04/23 Admit Type: Outpatient Age: 55 Room: [facility] ENDO ROOM 1 Gender: Female Note Status: Finalized Procedure:             Colonoscopy Indications:           Screening for colorectal malignant neoplasm Providers:             Andrey Farmer MD, MD Referring MD:          Irven Easterly. Kary Kos, MD (Referring MD) Medicines:             Monitored Anesthesia Care Complications:         No immediate complications. Estimated blood loss:                         Minimal. Procedure:             Pre-Anesthesia Assessment:                        - Prior to the procedure, a History and Physical was                         performed, and patient medications and allergies were                         reviewed. The patient is competent. The risks and                         benefits of the procedure and the sedation options and                         risks were discussed with the patient. All questions                         were answered and informed consent was obtained.                         Patient identification and proposed procedure were                         verified by the physician, the nurse, the anesthetist                         and the technician in the endoscopy suite. Mental                         Status Examination: alert and oriented. Airway                         Examination: normal oropharyngeal airway and neck                         mobility. Respiratory Examination: clear to                         auscultation. CV Examination: normal. Prophylactic  Antibiotics: The patient does not require prophylactic                         antibiotics. Prior Anticoagulants: The patient has                         taken no previous anticoagulant or antiplatelet                         agents.  ASA Grade Assessment: I - A normal, healthy                         patient. After reviewing the risks and benefits, the                         patient was deemed in satisfactory condition to                         undergo the procedure. The anesthesia plan was to use                         monitored anesthesia care (MAC). Immediately prior to                         administration of medications, the patient was                         re-assessed for adequacy to receive sedatives. The                         heart rate, respiratory rate, oxygen saturations,                         blood pressure, adequacy of pulmonary ventilation, and                         response to care were monitored throughout the                         procedure. The physical status of the patient was                         re-assessed after the procedure.                        After obtaining informed consent, the colonoscope was                         passed under direct vision. Throughout the procedure,                         the patient's blood pressure, pulse, and oxygen                         saturations were monitored continuously. The                         Colonoscope was introduced through the anus and  advanced to the the cecum, identified by appendiceal                         orifice and ileocecal valve. The colonoscopy was                         performed without difficulty. The patient tolerated                         the procedure well. The quality of the bowel                         preparation was good. Findings:      The perianal and digital rectal examinations were normal.      A localized area of mildly thickened folds of the mucosa was found at       the appendiceal orifice. Biopsies were taken with a cold forceps for       histology. Estimated blood loss was minimal.      A 6 mm polyp was found in the hepatic flexure. The polyp was sessile.       The  polyp was removed with a cold snare. Resection and retrieval were       complete. Estimated blood loss was minimal.      The exam was otherwise without abnormality on direct and retroflexion       views. Impression:            - Thickened folds of the mucosa at the appendiceal                         orifice. Biopsied.                        - One 6 mm polyp at the hepatic flexure, removed with                         a cold snare. Resected and retrieved.                        - The examination was otherwise normal on direct and                         retroflexion views. Recommendation:        - Discharge patient to home.                        - Resume previous diet.                        - Continue present medications.                        - Await pathology results.                        - Repeat colonoscopy for surveillance based on                         pathology results.                        -  Return to referring physician as previously                         scheduled. Procedure Code(s):     --- Professional ---                        [phone], Colonoscopy, flexible; with removal of                         tumor(s), polyp(s), or other lesion(s) by snare                         technique                        45380, 19, Colonoscopy, flexible; with biopsy, single                         or multiple Diagnosis Code(s):     --- Professional ---                        Z12.11, Encounter for screening for malignant neoplasm                         of colon                        K63.89, Other specified diseases of intestine                        K63.5, Polyp of colon CPT copyright 2019 [employer]. All rights reserved. The codes documented in this report are preliminary and upon coder review may  be revised to meet current compliance requirements. Andrey Farmer MD, MD 03/15/2021 10:41:01 AM Number of Addenda: 0 Note Initiated On: 03/15/2021 10:03  AM Estimated Blood Loss:  Estimated blood loss was minimal.      [facility]

## 2021-03-15 NOTE — Anesthesia Preprocedure Evaluation (Addendum)
Anesthesia Evaluation  Patient identified by MRN, date of birth, ID band Patient awake    Reviewed: Allergy & Precautions, H&P , NPO status , Patient's Chart, lab work & pertinent test results, reviewed documented beta blocker date and time   Airway Mallampati: II   Neck ROM: full    Dental  (+) Poor Dentition   Pulmonary neg pulmonary ROS, Current Smoker and Patient abstained from smoking.,    Pulmonary exam normal        Cardiovascular Exercise Tolerance: Good negative cardio ROS Normal cardiovascular exam Rhythm:regular Rate:Normal     Neuro/Psych  Headaches, Anxiety Depression CVA negative neurological ROS  negative psych ROS   GI/Hepatic negative GI ROS, Neg liver ROS,   Endo/Other  negative endocrine ROS  Renal/GU negative Renal ROS  negative genitourinary   Musculoskeletal   Abdominal   Peds  Hematology negative hematology ROS (+)   Anesthesia Other Findings Past Medical History: No date: Anxiety No date: Depression No date: Headache No date: Hyperlipidemia No date: Stroke [facility])     Comment:  TIA LAST OCTOBER Past Surgical History: No date: ABDOMINAL HYSTERECTOMY No date: APPENDECTOMY No date: BACK SURGERY No date: ECTOPIC PREGNANCY SURGERY 04/26/2018: TENDON TRANSFER; Right     Comment:  Procedure: TENDON TRANSFER-ANTERIOR TIBIAL TENDON               TRANSFER;  Surgeon: Samara Deist, DPM;  Location: [facility]               ORS;  Service: Podiatry;  Laterality: Right; BMI    Body Mass Index: 29.76 kg/m     Reproductive/Obstetrics negative OB ROS                            Anesthesia Physical Anesthesia Plan  ASA: III  Anesthesia Plan: General   Post-op Pain Management:    Induction:   PONV Risk Score and Plan:   Airway Management Planned:   Additional Equipment:   Intra-op Plan:   Post-operative Plan:   Informed Consent: I have reviewed the patients History  and Physical, chart, labs and discussed the procedure including the risks, benefits and alternatives for the proposed anesthesia with the patient or authorized representative who has indicated his/her understanding and acceptance.     Dental Advisory Given  Plan Discussed with: CRNA  Anesthesia Plan Comments:         Anesthesia Quick Evaluation

## 2021-03-16 ENCOUNTER — Encounter: Payer: Self-pay | Admitting: Gastroenterology

## 2021-03-16 LAB — SURGICAL PATHOLOGY

## 2021-03-17 NOTE — Anesthesia Postprocedure Evaluation (Signed)
Anesthesia Post Note  Patient: Diana Jarvis  Procedure(s) Performed: COLONOSCOPY WITH PROPOFOL (N/A )  Patient location during evaluation: PACU Anesthesia Type: General Level of consciousness: awake and alert Pain management: pain level controlled Vital Signs Assessment: post-procedure vital signs reviewed and stable Respiratory status: spontaneous breathing, nonlabored ventilation, respiratory function stable and patient connected to nasal cannula oxygen Cardiovascular status: blood pressure returned to baseline and stable Postop Assessment: no apparent nausea or vomiting Anesthetic complications: no   No complications documented.   Last Vitals:  Vitals:   03/15/21 1045 03/15/21 1054  BP:  110/70  Pulse:    Resp:    Temp: (!) 36.1 C   SpO2:      Last Pain:  Vitals:   03/16/21 0805  TempSrc:   PainSc: 0-No pain                 Molli Barrows

## 2021-12-12 ENCOUNTER — Other Ambulatory Visit: Payer: Self-pay | Admitting: Family Medicine

## 2021-12-12 DIAGNOSIS — Z1231 Encounter for screening mammogram for malignant neoplasm of breast: Secondary | ICD-10-CM

## 2022-01-26 ENCOUNTER — Other Ambulatory Visit: Payer: Self-pay

## 2022-01-26 ENCOUNTER — Ambulatory Visit
Admission: RE | Admit: 2022-01-26 | Discharge: 2022-01-26 | Disposition: A | Discharge status: Home / Self Care | Payer: Medicare Other | Source: Ambulatory Visit | Attending: Family Medicine | Admitting: Family Medicine

## 2022-01-26 DIAGNOSIS — Z1231 Encounter for screening mammogram for malignant neoplasm of breast: Secondary | ICD-10-CM

## 2022-12-12 DIAGNOSIS — Z0000 Encounter for general adult medical examination without abnormal findings: Secondary | ICD-10-CM | POA: Diagnosis not present

## 2022-12-12 DIAGNOSIS — I10 Essential (primary) hypertension: Secondary | ICD-10-CM | POA: Diagnosis not present

## 2022-12-12 DIAGNOSIS — E785 Hyperlipidemia, unspecified: Secondary | ICD-10-CM | POA: Diagnosis not present

## 2022-12-12 DIAGNOSIS — F4321 Adjustment disorder with depressed mood: Secondary | ICD-10-CM | POA: Diagnosis not present

## 2022-12-19 DIAGNOSIS — E785 Hyperlipidemia, unspecified: Secondary | ICD-10-CM | POA: Diagnosis not present

## 2022-12-19 DIAGNOSIS — I10 Essential (primary) hypertension: Secondary | ICD-10-CM | POA: Diagnosis not present

## 2023-03-21 ENCOUNTER — Other Ambulatory Visit: Payer: Self-pay | Admitting: Family Medicine

## 2023-03-21 DIAGNOSIS — Z1231 Encounter for screening mammogram for malignant neoplasm of breast: Secondary | ICD-10-CM

## 2023-03-26 ENCOUNTER — Ambulatory Visit
Admission: RE | Admit: 2023-03-26 | Discharge: 2023-03-26 | Disposition: A | Discharge status: Home / Self Care | Payer: PPO | Source: Ambulatory Visit | Attending: Family Medicine | Admitting: Family Medicine

## 2023-03-26 DIAGNOSIS — Z1231 Encounter for screening mammogram for malignant neoplasm of breast: Secondary | ICD-10-CM | POA: Diagnosis not present

## 2023-06-12 DIAGNOSIS — E785 Hyperlipidemia, unspecified: Secondary | ICD-10-CM | POA: Diagnosis not present

## 2023-06-12 DIAGNOSIS — F4321 Adjustment disorder with depressed mood: Secondary | ICD-10-CM | POA: Diagnosis not present

## 2023-06-12 DIAGNOSIS — I10 Essential (primary) hypertension: Secondary | ICD-10-CM | POA: Diagnosis not present

## 2023-06-12 DIAGNOSIS — Z636 Dependent relative needing care at home: Secondary | ICD-10-CM | POA: Diagnosis not present

## 2023-10-07 IMAGING — MG MM DIGITAL SCREENING BILAT W/ TOMO AND CAD
8 series · 9 of 24 positions shown · non-contrast
Comparison: Previous exam(s).

CLINICAL DATA: Screening.

EXAM:
DIGITAL SCREENING BILATERAL MAMMOGRAM WITH TOMOSYNTHESIS AND CAD
TECHNIQUE: Bilateral screening digital craniocaudal and mediolateral oblique
mammograms were obtained. Bilateral screening digital breast
tomosynthesis was performed. The images were evaluated with
computer-aided detection.

[L CC synth-2D]
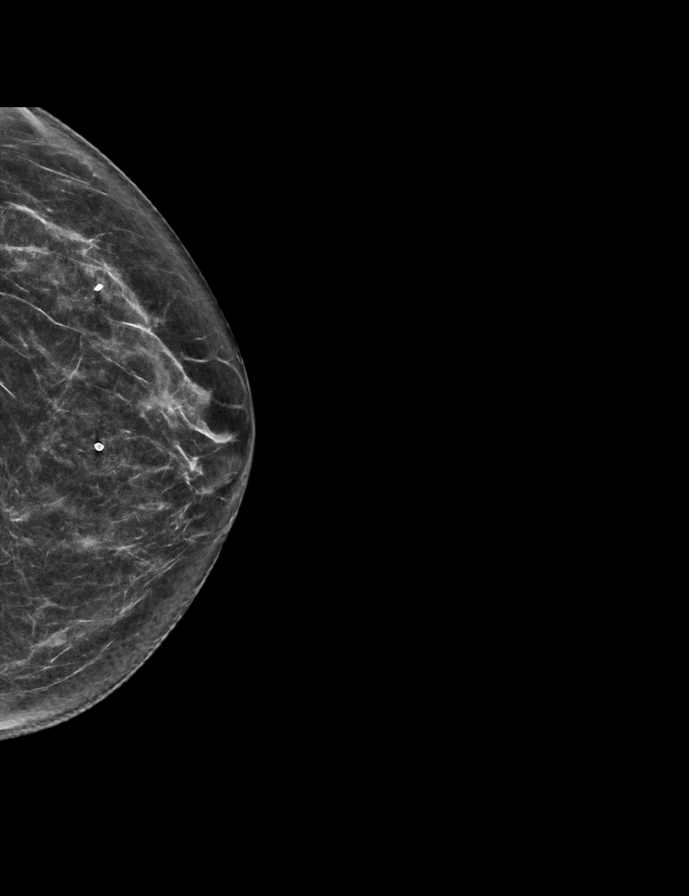

[R CC synth-2D]
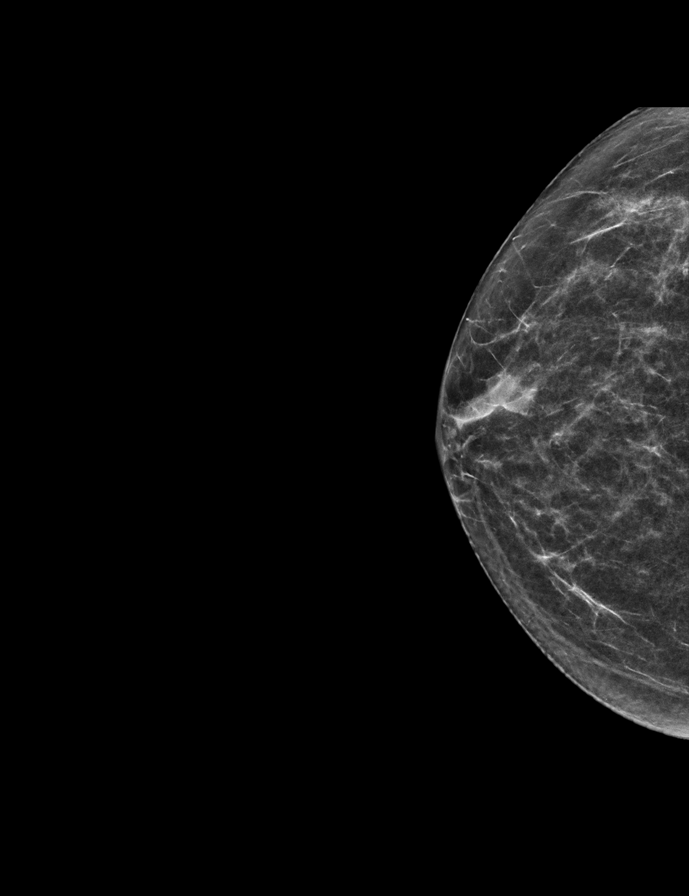

[L MLO synth-2D]
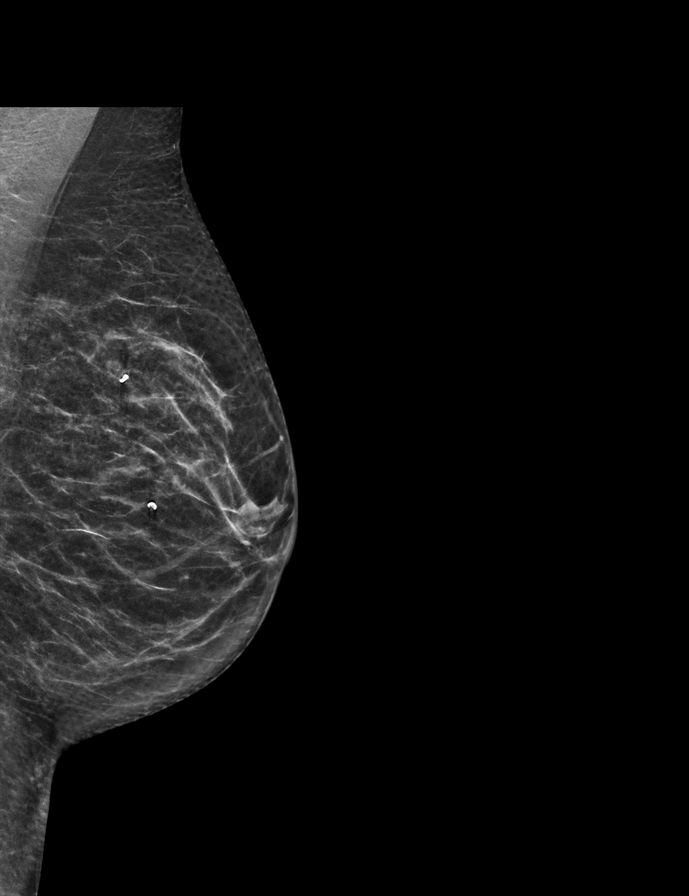

[R MLO synth-2D]
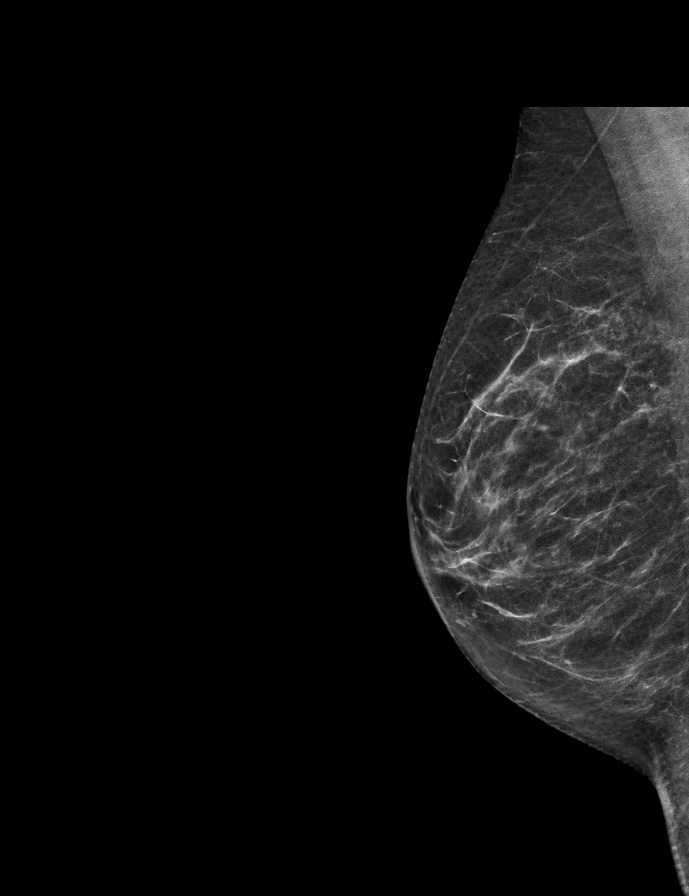

[L CC tomo · 2 of 59 frames shown]
[frame 20/59]
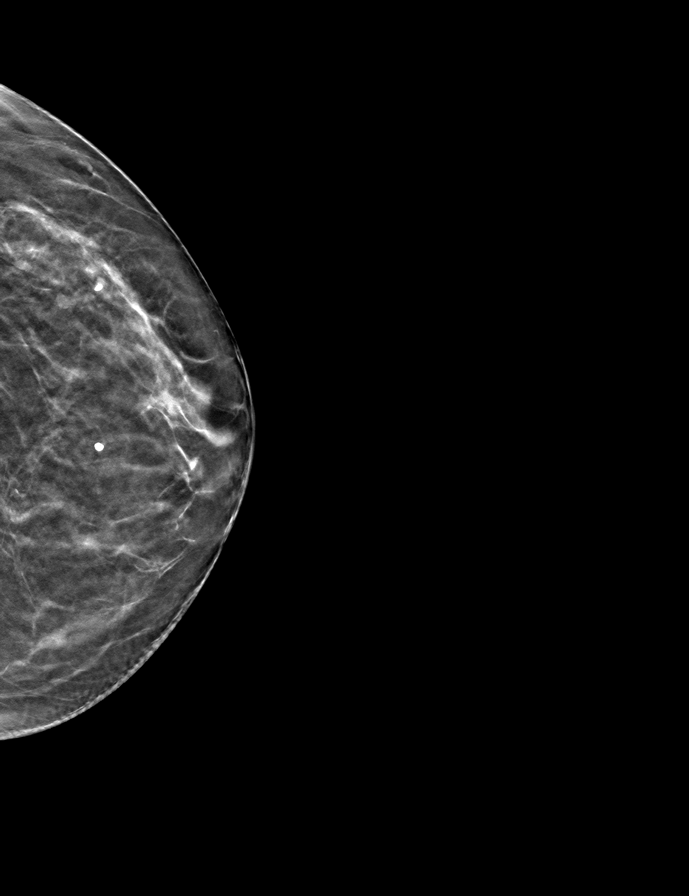
[frame 30/59]
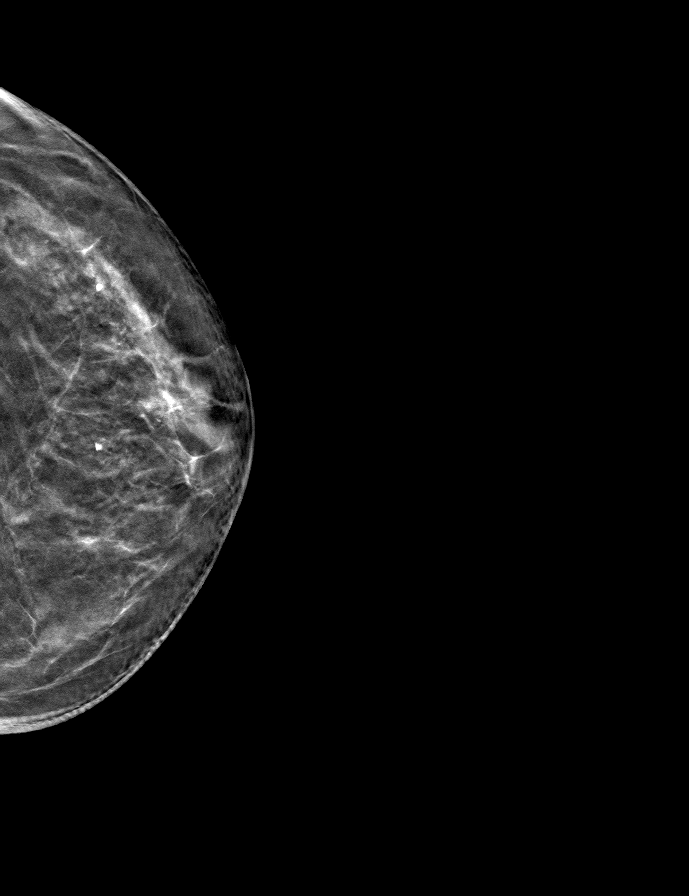

[L MLO tomo · tomo slice 27/54.0]
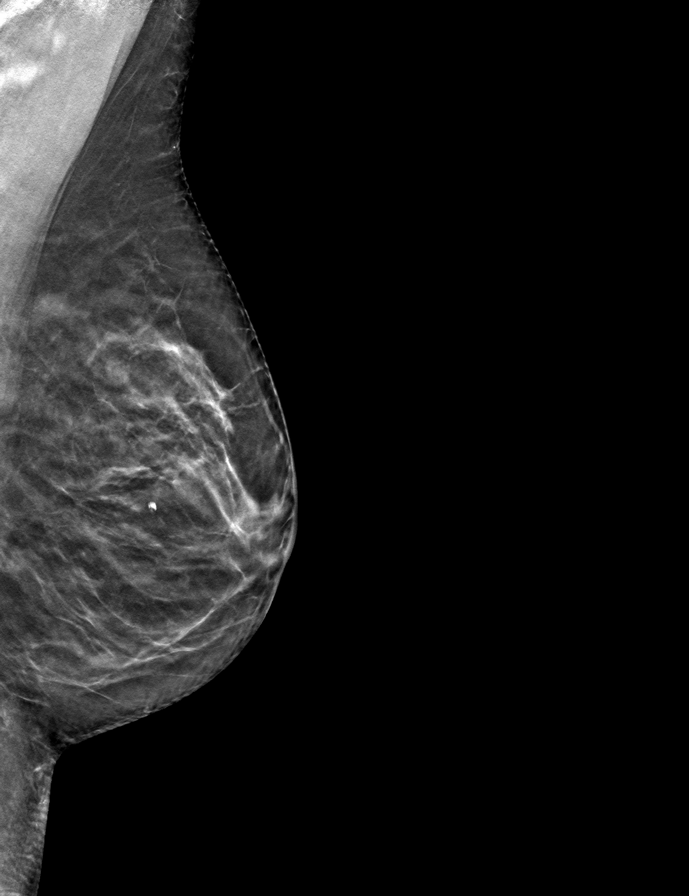

[R CC tomo · tomo slice 27/52.0]
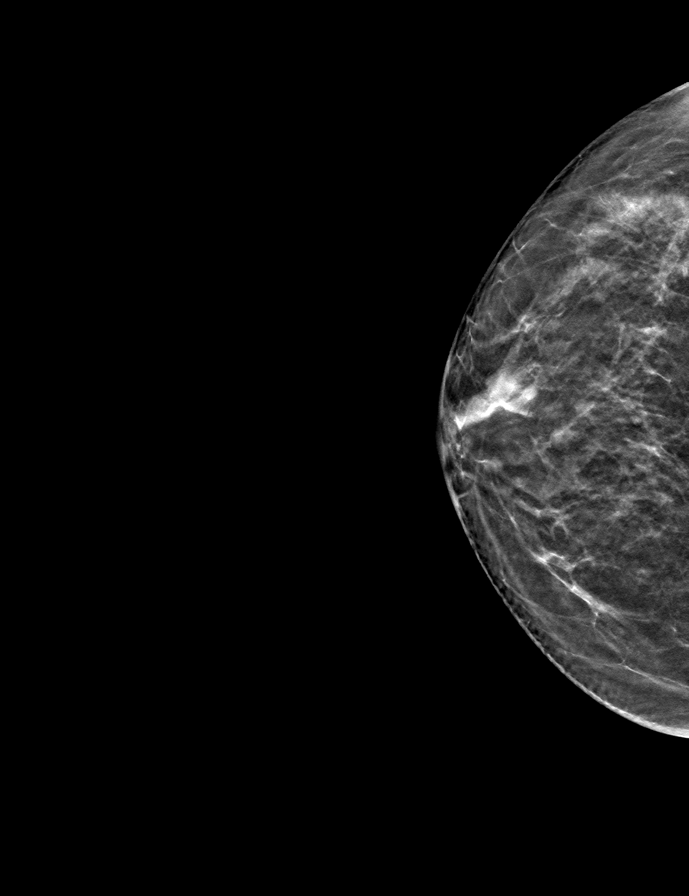

[R MLO tomo · tomo slice 29/57.0]
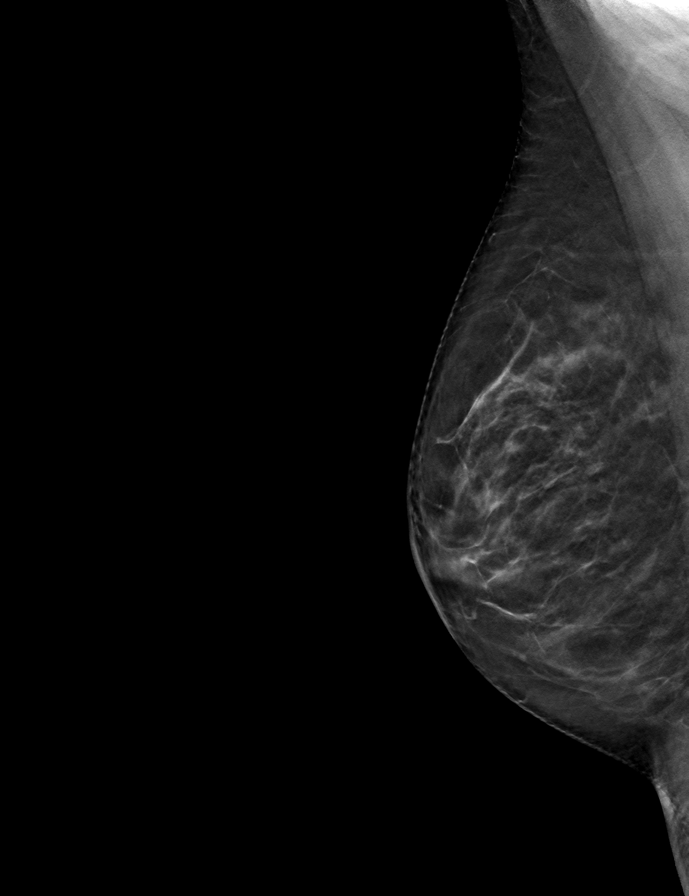

[9 of 24 positions shown; findings below may reference images not displayed]

ACR Breast Density Category b: There are scattered areas of
fibroglandular density.
FINDINGS: There are no findings suspicious for malignancy.
IMPRESSION: No mammographic evidence of malignancy. A result letter of this
screening mammogram will be mailed directly to the patient.

RECOMMENDATION:
Screening mammogram in one year. (Code:51-O-LD2)

BI-RADS CATEGORY  1: Negative.

## 2023-12-07 DIAGNOSIS — R509 Fever, unspecified: Secondary | ICD-10-CM | POA: Diagnosis not present

## 2023-12-07 DIAGNOSIS — R1031 Right lower quadrant pain: Secondary | ICD-10-CM | POA: Diagnosis not present

## 2023-12-07 DIAGNOSIS — R0981 Nasal congestion: Secondary | ICD-10-CM | POA: Diagnosis not present

## 2023-12-07 DIAGNOSIS — R197 Diarrhea, unspecified: Secondary | ICD-10-CM | POA: Diagnosis not present

## 2023-12-07 DIAGNOSIS — E876 Hypokalemia: Secondary | ICD-10-CM | POA: Diagnosis not present

## 2023-12-07 DIAGNOSIS — R059 Cough, unspecified: Secondary | ICD-10-CM | POA: Diagnosis not present

## 2023-12-07 DIAGNOSIS — R633 Feeding difficulties, unspecified: Secondary | ICD-10-CM | POA: Diagnosis not present

## 2023-12-07 DIAGNOSIS — F419 Anxiety disorder, unspecified: Secondary | ICD-10-CM | POA: Diagnosis not present

## 2023-12-07 DIAGNOSIS — J189 Pneumonia, unspecified organism: Secondary | ICD-10-CM | POA: Diagnosis not present

## 2023-12-07 DIAGNOSIS — R519 Headache, unspecified: Secondary | ICD-10-CM | POA: Diagnosis not present

## 2023-12-07 DIAGNOSIS — Z885 Allergy status to narcotic agent status: Secondary | ICD-10-CM | POA: Diagnosis not present

## 2023-12-07 DIAGNOSIS — Z90710 Acquired absence of both cervix and uterus: Secondary | ICD-10-CM | POA: Diagnosis not present

## 2023-12-07 DIAGNOSIS — Z1152 Encounter for screening for COVID-19: Secondary | ICD-10-CM | POA: Diagnosis not present

## 2023-12-07 DIAGNOSIS — R0602 Shortness of breath: Secondary | ICD-10-CM | POA: Diagnosis not present

## 2023-12-07 DIAGNOSIS — F32A Depression, unspecified: Secondary | ICD-10-CM | POA: Diagnosis not present

## 2023-12-18 DIAGNOSIS — Z8701 Personal history of pneumonia (recurrent): Secondary | ICD-10-CM | POA: Diagnosis not present

## 2023-12-18 DIAGNOSIS — R5381 Other malaise: Secondary | ICD-10-CM | POA: Diagnosis not present

## 2023-12-18 DIAGNOSIS — R8290 Unspecified abnormal findings in urine: Secondary | ICD-10-CM | POA: Diagnosis not present

## 2023-12-18 DIAGNOSIS — R413 Other amnesia: Secondary | ICD-10-CM | POA: Diagnosis not present

## 2023-12-18 DIAGNOSIS — E876 Hypokalemia: Secondary | ICD-10-CM | POA: Diagnosis not present

## 2023-12-18 DIAGNOSIS — K869 Disease of pancreas, unspecified: Secondary | ICD-10-CM | POA: Diagnosis not present

## 2023-12-18 DIAGNOSIS — R5383 Other fatigue: Secondary | ICD-10-CM | POA: Diagnosis not present

## 2023-12-18 DIAGNOSIS — Z0000 Encounter for general adult medical examination without abnormal findings: Secondary | ICD-10-CM | POA: Diagnosis not present

## 2023-12-19 DIAGNOSIS — R5383 Other fatigue: Secondary | ICD-10-CM | POA: Diagnosis not present

## 2023-12-19 DIAGNOSIS — K869 Disease of pancreas, unspecified: Secondary | ICD-10-CM | POA: Diagnosis not present

## 2023-12-19 DIAGNOSIS — R413 Other amnesia: Secondary | ICD-10-CM | POA: Diagnosis not present

## 2023-12-19 DIAGNOSIS — R5381 Other malaise: Secondary | ICD-10-CM | POA: Diagnosis not present

## 2023-12-19 DIAGNOSIS — Z8701 Personal history of pneumonia (recurrent): Secondary | ICD-10-CM | POA: Diagnosis not present

## 2023-12-19 DIAGNOSIS — Z0000 Encounter for general adult medical examination without abnormal findings: Secondary | ICD-10-CM | POA: Diagnosis not present

## 2023-12-19 DIAGNOSIS — E876 Hypokalemia: Secondary | ICD-10-CM | POA: Diagnosis not present

## 2023-12-19 DIAGNOSIS — R8290 Unspecified abnormal findings in urine: Secondary | ICD-10-CM | POA: Diagnosis not present

## 2024-01-22 DIAGNOSIS — F32A Depression, unspecified: Secondary | ICD-10-CM | POA: Diagnosis not present

## 2024-01-22 DIAGNOSIS — R413 Other amnesia: Secondary | ICD-10-CM | POA: Diagnosis not present

## 2024-01-22 DIAGNOSIS — F419 Anxiety disorder, unspecified: Secondary | ICD-10-CM | POA: Diagnosis not present

## 2024-01-22 DIAGNOSIS — R0981 Nasal congestion: Secondary | ICD-10-CM | POA: Diagnosis not present

## 2024-01-22 DIAGNOSIS — M79604 Pain in right leg: Secondary | ICD-10-CM | POA: Diagnosis not present

## 2024-01-22 DIAGNOSIS — G4700 Insomnia, unspecified: Secondary | ICD-10-CM | POA: Diagnosis not present

## 2024-01-23 ENCOUNTER — Other Ambulatory Visit: Payer: Self-pay | Admitting: Family Medicine

## 2024-01-23 DIAGNOSIS — K869 Disease of pancreas, unspecified: Secondary | ICD-10-CM

## 2024-01-23 DIAGNOSIS — R9389 Abnormal findings on diagnostic imaging of other specified body structures: Secondary | ICD-10-CM

## 2024-01-26 ENCOUNTER — Ambulatory Visit
Admission: RE | Admit: 2024-01-26 | Discharge: 2024-01-26 | Disposition: A | Discharge status: Home / Self Care | Source: Ambulatory Visit | Attending: Family Medicine | Admitting: Family Medicine

## 2024-01-26 DIAGNOSIS — R9389 Abnormal findings on diagnostic imaging of other specified body structures: Secondary | ICD-10-CM | POA: Insufficient documentation

## 2024-01-26 DIAGNOSIS — I700 Atherosclerosis of aorta: Secondary | ICD-10-CM | POA: Diagnosis not present

## 2024-01-26 DIAGNOSIS — K869 Disease of pancreas, unspecified: Secondary | ICD-10-CM | POA: Diagnosis not present

## 2024-01-26 MED ORDER — GADOBUTROL 1 MMOL/ML IV SOLN
7.5000 mL | Freq: Once | INTRAVENOUS | Status: AC | PRN
Start: 2024-01-26 — End: 2024-01-26
  Administered 2024-01-26: 7.5 mL via INTRAVENOUS

## 2024-01-27 ENCOUNTER — Other Ambulatory Visit: Payer: Self-pay | Admitting: Family Medicine

## 2024-01-27 DIAGNOSIS — R9389 Abnormal findings on diagnostic imaging of other specified body structures: Secondary | ICD-10-CM

## 2024-01-27 DIAGNOSIS — K869 Disease of pancreas, unspecified: Secondary | ICD-10-CM

## 2024-03-19 ENCOUNTER — Other Ambulatory Visit: Payer: Self-pay | Admitting: Family Medicine

## 2024-03-19 DIAGNOSIS — Z1231 Encounter for screening mammogram for malignant neoplasm of breast: Secondary | ICD-10-CM

## 2024-04-01 ENCOUNTER — Encounter

## 2024-04-09 ENCOUNTER — Ambulatory Visit
Admission: RE | Admit: 2024-04-09 | Discharge: 2024-04-09 | Disposition: A | Discharge status: Home / Self Care | Source: Ambulatory Visit | Attending: Family Medicine | Admitting: Family Medicine

## 2024-04-09 DIAGNOSIS — Z1231 Encounter for screening mammogram for malignant neoplasm of breast: Secondary | ICD-10-CM | POA: Insufficient documentation

## 2024-04-15 DIAGNOSIS — R058 Other specified cough: Secondary | ICD-10-CM | POA: Diagnosis not present

## 2024-05-20 DIAGNOSIS — M21371 Foot drop, right foot: Secondary | ICD-10-CM | POA: Diagnosis not present

## 2024-08-07 DIAGNOSIS — J40 Bronchitis, not specified as acute or chronic: Secondary | ICD-10-CM | POA: Diagnosis not present

## 2024-08-07 DIAGNOSIS — Z720 Tobacco use: Secondary | ICD-10-CM | POA: Diagnosis not present

## 2024-08-07 DIAGNOSIS — R058 Other specified cough: Secondary | ICD-10-CM | POA: Diagnosis not present

## 2024-08-07 DIAGNOSIS — T3695XA Adverse effect of unspecified systemic antibiotic, initial encounter: Secondary | ICD-10-CM | POA: Diagnosis not present

## 2024-08-07 DIAGNOSIS — B379 Candidiasis, unspecified: Secondary | ICD-10-CM | POA: Diagnosis not present
